# Patient Record
Sex: Male | Born: 1950 | Race: Black or African American | Hispanic: No | Marital: Single | Smoking: Former smoker
Health system: Southern US, Community
[De-identification: ages and names within clinical notes are randomized; demographics above are authoritative.]

## PROBLEM LIST (undated history)

## (undated) DIAGNOSIS — N400 Enlarged prostate without lower urinary tract symptoms: Secondary | ICD-10-CM

## (undated) DIAGNOSIS — I10 Essential (primary) hypertension: Secondary | ICD-10-CM

## (undated) DIAGNOSIS — E785 Hyperlipidemia, unspecified: Secondary | ICD-10-CM

## (undated) HISTORY — DX: Hyperlipidemia, unspecified: E78.5

## (undated) HISTORY — DX: Benign prostatic hyperplasia without lower urinary tract symptoms: N40.0

---

## 2004-09-10 ENCOUNTER — Ambulatory Visit: Payer: Self-pay | Admitting: Internal Medicine

## 2005-06-16 ENCOUNTER — Ambulatory Visit: Payer: Self-pay | Admitting: Internal Medicine

## 2005-08-18 ENCOUNTER — Ambulatory Visit: Payer: Self-pay | Admitting: Specialist

## 2006-03-10 ENCOUNTER — Emergency Department: Payer: Self-pay | Admitting: Emergency Medicine

## 2006-03-14 ENCOUNTER — Ambulatory Visit: Payer: Self-pay | Admitting: General Practice

## 2006-03-14 ENCOUNTER — Other Ambulatory Visit: Payer: Self-pay

## 2006-03-27 ENCOUNTER — Encounter: Payer: Self-pay | Admitting: General Practice

## 2006-04-01 ENCOUNTER — Encounter: Payer: Self-pay | Admitting: General Practice

## 2006-09-19 ENCOUNTER — Ambulatory Visit: Payer: Self-pay | Admitting: Internal Medicine

## 2007-09-24 ENCOUNTER — Ambulatory Visit: Payer: Self-pay | Admitting: Internal Medicine

## 2008-09-23 ENCOUNTER — Ambulatory Visit: Payer: Self-pay | Admitting: Internal Medicine

## 2009-09-24 ENCOUNTER — Other Ambulatory Visit: Payer: Self-pay | Admitting: Internal Medicine

## 2010-09-28 ENCOUNTER — Other Ambulatory Visit: Payer: Self-pay | Admitting: Internal Medicine

## 2011-09-28 ENCOUNTER — Other Ambulatory Visit: Payer: Self-pay | Admitting: Internal Medicine

## 2011-09-28 LAB — HEPATIC FUNCTION PANEL A (ARMC)
Albumin: 3.8 g/dL (ref 3.4–5.0)
Bilirubin, Direct: 0.1 mg/dL (ref 0.00–0.20)
Bilirubin,Total: 0.5 mg/dL (ref 0.2–1.0)
SGPT (ALT): 39 U/L
Total Protein: 7.5 g/dL (ref 6.4–8.2)

## 2011-09-28 LAB — LIPID PANEL
Ldl Cholesterol, Calc: 133 mg/dL — ABNORMAL HIGH (ref 0–100)
Triglycerides: 80 mg/dL (ref 0–200)

## 2012-09-28 ENCOUNTER — Other Ambulatory Visit: Payer: Self-pay | Admitting: Internal Medicine

## 2012-09-28 LAB — COMPREHENSIVE METABOLIC PANEL
Albumin: 3.8 g/dL (ref 3.4–5.0)
Anion Gap: 6 — ABNORMAL LOW (ref 7–16)
BUN: 8 mg/dL (ref 7–18)
Calcium, Total: 8.7 mg/dL (ref 8.5–10.1)
Chloride: 105 mmol/L (ref 98–107)
Co2: 28 mmol/L (ref 21–32)
EGFR (African American): >60
EGFR (Non-African Amer.): >60
Glucose: 98 mg/dL (ref 65–99)
Osmolality: 276 (ref 275–301)
Potassium: 4.4 mmol/L (ref 3.5–5.1)
SGOT(AST): 31 U/L (ref 15–37)
SGPT (ALT): 34 U/L (ref 12–78)
Total Protein: 7.1 g/dL (ref 6.4–8.2)

## 2012-09-28 LAB — LIPID PANEL
HDL Cholesterol: 58 mg/dL (ref 40–60)
Ldl Cholesterol, Calc: 64 mg/dL (ref 0–100)

## 2013-11-13 DIAGNOSIS — N138 Other obstructive and reflux uropathy: Secondary | ICD-10-CM | POA: Insufficient documentation

## 2013-11-13 DIAGNOSIS — R361 Hematospermia: Secondary | ICD-10-CM | POA: Insufficient documentation

## 2013-11-13 DIAGNOSIS — R339 Retention of urine, unspecified: Secondary | ICD-10-CM | POA: Insufficient documentation

## 2013-11-13 DIAGNOSIS — N401 Enlarged prostate with lower urinary tract symptoms: Secondary | ICD-10-CM | POA: Insufficient documentation

## 2013-11-13 DIAGNOSIS — R6882 Decreased libido: Secondary | ICD-10-CM | POA: Insufficient documentation

## 2013-11-13 DIAGNOSIS — N529 Male erectile dysfunction, unspecified: Secondary | ICD-10-CM | POA: Insufficient documentation

## 2014-03-25 DIAGNOSIS — R972 Elevated prostate specific antigen [PSA]: Secondary | ICD-10-CM | POA: Insufficient documentation

## 2014-05-01 DIAGNOSIS — Z79899 Other long term (current) drug therapy: Secondary | ICD-10-CM | POA: Insufficient documentation

## 2014-05-01 DIAGNOSIS — E291 Testicular hypofunction: Secondary | ICD-10-CM | POA: Insufficient documentation

## 2018-07-30 ENCOUNTER — Encounter: Payer: Self-pay | Admitting: Family Medicine

## 2018-08-06 ENCOUNTER — Telehealth: Payer: Self-pay

## 2018-08-06 NOTE — Telephone Encounter (Signed)
Pt left vm to schedule procedure

## 2018-08-07 ENCOUNTER — Other Ambulatory Visit: Payer: Self-pay

## 2018-08-07 DIAGNOSIS — R195 Other fecal abnormalities: Secondary | ICD-10-CM

## 2018-08-07 NOTE — Telephone Encounter (Signed)
Returned patients call.  He has been scheduled for his colonoscopy with Dr. Vicente Males at [facility] on 08/16/18.  Thanks [employer]

## 2018-08-13 ENCOUNTER — Telehealth: Payer: Self-pay | Admitting: Gastroenterology

## 2018-08-13 ENCOUNTER — Encounter: Payer: Self-pay | Admitting: Urology

## 2018-08-13 ENCOUNTER — Other Ambulatory Visit: Payer: Self-pay

## 2018-08-13 ENCOUNTER — Ambulatory Visit (INDEPENDENT_AMBULATORY_CARE_PROVIDER_SITE_OTHER): Payer: Medicare Other | Admitting: Urology

## 2018-08-13 VITALS — BP 171/83 | HR 80 | Ht 72.0 in | Wt 220.6 lb

## 2018-08-13 DIAGNOSIS — N5201 Erectile dysfunction due to arterial insufficiency: Secondary | ICD-10-CM

## 2018-08-13 DIAGNOSIS — R339 Retention of urine, unspecified: Secondary | ICD-10-CM

## 2018-08-13 DIAGNOSIS — R3914 Feeling of incomplete bladder emptying: Secondary | ICD-10-CM

## 2018-08-13 DIAGNOSIS — E291 Testicular hypofunction: Secondary | ICD-10-CM

## 2018-08-13 DIAGNOSIS — N401 Enlarged prostate with lower urinary tract symptoms: Secondary | ICD-10-CM

## 2018-08-13 LAB — URINALYSIS, COMPLETE
Bilirubin, UA: NEGATIVE
Glucose, UA: NEGATIVE
Ketones, UA: NEGATIVE
Leukocytes, UA: NEGATIVE
NITRITE UA: NEGATIVE
Protein, UA: NEGATIVE
Specific Gravity, UA: 1.015 (ref 1.005–1.030)
Urobilinogen, Ur: 0.2 mg/dL (ref 0.2–1.0)
pH, UA: 6.5 (ref 5.0–7.5)

## 2018-08-13 LAB — MICROSCOPIC EXAMINATION
Bacteria, UA: NONE SEEN
EPITHELIAL CELLS (NON RENAL): NONE SEEN /HPF (ref 0–10)
WBC, UA: NONE SEEN /hpf (ref 0–5)

## 2018-08-13 LAB — BLADDER SCAN AMB NON-IMAGING

## 2018-08-13 MED ORDER — PEG 3350-KCL-NA BICARB-NACL 420 G PO SOLR: 4000.0000 mL | Freq: Once | ORAL | 0 refills | Status: AC

## 2018-08-13 MED ORDER — TESTOSTERONE 20.25 MG/ACT (1.62%) TD GEL: TRANSDERMAL | 2 refills | Status: DC

## 2018-08-13 NOTE — Telephone Encounter (Signed)
PLEASE SEND NEW SCRIPT FOR COLONOSCOPY SCHEDULED ON 08-16-2018 WITH DR ANNA. THE SU-PREP WAS OVER $100 DOLLARS WITH INS. PLEASE CALL IN ANOTHER SCRIPT FOR PATIENT

## 2018-08-13 NOTE — Telephone Encounter (Signed)
Patients bowel prep has been changed to [employer].  New rx has been sent to [facility].  Patient has been notified and advised to begin drinking the evening before the procedure at 5pm.  Drink 8 oz every 30 minutes until contents have been completed entirely.  Do not eat or drink 4 hours prior to procedure.  Thanks [employer]

## 2018-08-13 NOTE — Progress Notes (Signed)
08/13/2018 9:17 AM   Jerome Nichols Mar 10, 1951 [id number]  Referring provider: No referring provider defined for this encounter.  Chief Complaint  Patient presents with  . Benign Prostatic Hypertrophy   Urologic history: 1.  Hypogonadism  -On testosterone gel  2.  Erectile dysfunction   -On sildenafil 50 mg  3.  BPH with lower urinary tract symptoms   HPI: 68 year old male presents to establish local urologic care.  He had previously seen Dr. Jacqlyn Larsen at [facility] and was last seen there by 1 of the physician assistants in June 2019.  At that visit he had complained of increased urinary frequency and urgency and was started on tamsulosin however he states he is no longer taking this medication.  He presently has no bothersome lower urinary tract symptoms.  He remains on generic testosterone gel 2 pumps daily.  He has good energy and libido.  He has erectile dysfunction on PDE 5 inhibitor.  He also wanted discuss penile curvature today.  He states he noted leftward curvature of his penis at the base in 2006.  He has no pain with erections.  He estimates the curvature in approximately 30 degrees.  No hourglass deformity.  No history of penile fracture.   PMH: Past Medical History:  Diagnosis Date  . BPH (benign prostatic hyperplasia)   . Hyperlipidemia     Surgical History: History reviewed. No pertinent surgical history.  Home Medications:  Allergies as of 08/13/2018   No Known Allergies     Medication List       Accurate as of August 13, 2018  9:17 AM. Always use your most recent med list.        aspirin EC 81 MG tablet Take by mouth.   simvastatin 20 MG tablet Commonly known as:  ZOCOR Take 20 mg by mouth daily.       Allergies: No Known Allergies  Family History: No family history on file.  Social History:  reports that he quit smoking about 16 years ago. His smoking use included cigarettes. He has never used smokeless tobacco. He reports previous  alcohol use. He reports that he does not use drugs.  ROS: UROLOGY Frequent Urination?: No Hard to postpone urination?: Yes Burning/pain with urination?: No Get up at night to urinate?: Yes Leakage of urine?: Yes Urine stream starts and stops?: No Trouble starting stream?: No Do you have to strain to urinate?: No Blood in urine?: No Urinary tract infection?: No Sexually transmitted disease?: No Injury to kidneys or bladder?: No Painful intercourse?: No Weak stream?: No Erection problems?: Yes Penile pain?: No  Gastrointestinal Nausea?: No Vomiting?: No Indigestion/heartburn?: No Diarrhea?: No Constipation?: No  Constitutional Fever: No Night sweats?: Yes Weight loss?: No Fatigue?: No  Skin Skin rash/lesions?: No Itching?: Yes  Eyes Blurred vision?: No Double vision?: No  Ears/Nose/Throat Sore throat?: No Sinus problems?: No  Hematologic/Lymphatic Swollen glands?: No Easy bruising?: No  Cardiovascular Leg swelling?: No Chest pain?: No  Respiratory Cough?: No Shortness of breath?: No  Endocrine Excessive thirst?: No  Musculoskeletal Back pain?: No Joint pain?: Yes  Neurological Headaches?: No Dizziness?: No  Psychologic Depression?: No Anxiety?: No  Physical Exam: BP (!) 171/83 (BP Location: Left Arm, Patient Position: Sitting, Cuff Size: Large)   Pulse 80   Ht 6' (1.829 m)   Wt 220 lb 9.6 oz (100.1 kg)   BMI 29.92 kg/m   Constitutional:  Alert and oriented, No acute distress. HEENT: [place] AT, moist mucus membranes.  Trachea midline,  no masses. Cardiovascular: No clubbing, cyanosis, or edema. Respiratory: Normal respiratory effort, no increased work of breathing. GI: Abdomen is soft, nontender, nondistended, no abdominal masses GU: No CVA tenderness.  Penis circumcised.  There is a proximal shaft dorsal plaque.  A left corporal plaque is not appreciated.  Testes descended bilateral without masses or tenderness. Lymph: No cervical or  inguinal lymphadenopathy. Skin: No rashes, bruises or suspicious lesions. Neurologic: Grossly intact, no focal deficits, moving all 4 extremities. Psychiatric: Normal mood and affect.   Assessment & Plan:    1. Hypogonadism in male Doing well on topical TRT.  Refill was sent to his pharmacy.  Follow-up 6 months  2. Erectile dysfunction due to arterial insufficiency Continue sildenafil  3. Benign prostatic hyperplasia with incomplete bladder emptying PVR by bladder scan today was 70 mL   Abbie Sons, MD  [facility] [address], [place], [place] [address] [phone]

## 2018-08-14 ENCOUNTER — Telehealth: Payer: Self-pay | Admitting: Gastroenterology

## 2018-08-14 LAB — HEMATOCRIT: HEMATOCRIT: 43.7 % (ref 37.5–51.0)

## 2018-08-14 LAB — TESTOSTERONE: Testosterone: 590 ng/dL (ref 264–916)

## 2018-08-14 NOTE — Telephone Encounter (Signed)
Patient has been advised to drink bowel prep 8 oz every 30 mins starting at 5pm until he has completed the entire container.  Thanks [employer]

## 2018-08-14 NOTE — Telephone Encounter (Signed)
Has colonoscopy scheduled for Thursday Aug 16, 2018, please call.  Has questions about how to take his prep

## 2018-08-16 ENCOUNTER — Encounter
Admission: RE | Disposition: A | Discharge status: Home / Self Care | Payer: Self-pay | Source: Home / Self Care | Attending: Gastroenterology

## 2018-08-16 ENCOUNTER — Encounter: Payer: Self-pay | Admitting: Emergency Medicine

## 2018-08-16 ENCOUNTER — Ambulatory Visit
Admission: RE | Admit: 2018-08-16 | Discharge: 2018-08-16 | Disposition: A | Discharge status: Home / Self Care | Payer: Medicare Other | Attending: Gastroenterology | Admitting: Gastroenterology

## 2018-08-16 ENCOUNTER — Ambulatory Visit: Payer: Medicare Other | Admitting: Certified Registered"

## 2018-08-16 DIAGNOSIS — K921 Melena: Secondary | ICD-10-CM | POA: Diagnosis not present

## 2018-08-16 DIAGNOSIS — K640 First degree hemorrhoids: Secondary | ICD-10-CM | POA: Diagnosis not present

## 2018-08-16 DIAGNOSIS — E785 Hyperlipidemia, unspecified: Secondary | ICD-10-CM | POA: Insufficient documentation

## 2018-08-16 DIAGNOSIS — K635 Polyp of colon: Secondary | ICD-10-CM | POA: Insufficient documentation

## 2018-08-16 DIAGNOSIS — Z87891 Personal history of nicotine dependence: Secondary | ICD-10-CM | POA: Insufficient documentation

## 2018-08-16 DIAGNOSIS — R195 Other fecal abnormalities: Secondary | ICD-10-CM

## 2018-08-16 DIAGNOSIS — Z7982 Long term (current) use of aspirin: Secondary | ICD-10-CM | POA: Insufficient documentation

## 2018-08-16 DIAGNOSIS — D122 Benign neoplasm of ascending colon: Secondary | ICD-10-CM

## 2018-08-16 HISTORY — PX: COLONOSCOPY WITH PROPOFOL: SHX5780

## 2018-08-16 SURGERY — COLONOSCOPY WITH PROPOFOL
Anesthesia: General

## 2018-08-16 MED ORDER — PROPOFOL 10 MG/ML IV BOLUS
INTRAVENOUS | Status: DC | PRN
  Administered 2018-08-16 (×2): 20 mg via INTRAVENOUS
  Administered 2018-08-16: 30 mg via INTRAVENOUS
  Administered 2018-08-16: 60 mg via INTRAVENOUS
  Administered 2018-08-16: 20 mg via INTRAVENOUS

## 2018-08-16 MED ORDER — SODIUM CHLORIDE 0.9 % IV SOLN
INTRAVENOUS | Status: DC
  Administered 2018-08-16: 1000 mL via INTRAVENOUS

## 2018-08-16 MED ORDER — PROPOFOL 500 MG/50ML IV EMUL
INTRAVENOUS | Status: DC | PRN
  Administered 2018-08-16: 100 ug/kg/min via INTRAVENOUS

## 2018-08-16 MED ORDER — PHENYLEPHRINE HCL 10 MG/ML IJ SOLN
INTRAMUSCULAR | Status: DC | PRN
  Administered 2018-08-16 (×2): 150 ug via INTRAVENOUS

## 2018-08-16 MED ORDER — LIDOCAINE HCL (CARDIAC) PF 100 MG/5ML IV SOSY
PREFILLED_SYRINGE | INTRAVENOUS | Status: DC | PRN
  Administered 2018-08-16: 80 mg via INTRATRACHEAL

## 2018-08-16 MED ORDER — PROPOFOL 10 MG/ML IV BOLUS
INTRAVENOUS | Status: AC
  Filled 2018-08-16: qty 40

## 2018-08-16 NOTE — Op Note (Signed)
[facility] Gastroenterology Patient Name: Jerome Nichols Procedure Date: 08/16/2018 10:11 AM MRN: [id number] Account #: [id number] Date of Birth: January 22, 1951 Admit Type: Outpatient Age: 68 Room: [facility] ENDO ROOM 3 Gender: Male Note Status: Finalized Procedure:            Colonoscopy Indications:          Rectal bleeding Providers:            Jonathon Bellows MD, MD Referring MD:         No Local Md, MD (Referring MD) Medicines:            Monitored Anesthesia Care Complications:        No immediate complications. Procedure:            Pre-Anesthesia Assessment:                       - Prior to the procedure, a History and Physical was                        performed, and patient medications, allergies and                        sensitivities were reviewed. The patient's tolerance of                        previous anesthesia was reviewed.                       - The risks and benefits of the procedure and the                        sedation options and risks were discussed with the                        patient. All questions were answered and informed                        consent was obtained.                       - ASA Grade Assessment: II - A patient with mild                        systemic disease.                       After obtaining informed consent, the colonoscope was                        passed under direct vision. Throughout the procedure,                        the patient's blood pressure, pulse, and oxygen                        saturations were monitored continuously. The                        Colonoscope was introduced through the anus and                        advanced  to the the cecum, identified by the                        appendiceal orifice, IC valve and transillumination.                        The colonoscopy was performed with ease. The patient                        tolerated the procedure well. The quality of the bowel             preparation was good. Findings:      The perianal and digital rectal examinations were normal.      Non-bleeding internal hemorrhoids were found during retroflexion. The       hemorrhoids were medium-sized and Grade I (internal hemorrhoids that do       not prolapse).      A 6 mm polyp was found in the ascending colon. The polyp was sessile.       Polypectomy was attempted, initially using a cold snare. Polyp resection       was incomplete with this device. This intervention then required a       different device and polypectomy technique. The polyp was removed with a       cold biopsy forceps. Resection was complete, but the polyp tissue was       only partially retrieved. Impression:           - Non-bleeding internal hemorrhoids.                       - One 6 mm polyp in the ascending colon, removed with a                        cold biopsy forceps. Complete resection. Partial                        retrieval. Recommendation:       - Discharge patient to home (with escort).                       - Resume previous diet.                       - Continue present medications.                       - Await pathology results.                       - Repeat colonoscopy in 5-10 years for surveillance                        based on pathology results. Procedure Code(s):    --- Professional ---                       [phone], Colonoscopy, flexible; with biopsy, single or                        multiple Diagnosis Code(s):    --- Professional ---  D12.2, Benign neoplasm of ascending colon                       K64.0, First degree hemorrhoids                       K62.5, Hemorrhage of anus and rectum CPT copyright 2018 American Medical Association. All rights reserved. The codes documented in this report are preliminary and upon coder review may  be revised to meet current compliance requirements. Jonathon Bellows, MD Jonathon Bellows MD, MD 08/16/2018 10:39:15 AM This report  has been signed electronically. Number of Addenda: 0 Note Initiated On: 08/16/2018 10:11 AM Scope Withdrawal Time: 0 hours 13 minutes 16 seconds  Total Procedure Duration: 0 hours 19 minutes 8 seconds       [facility]

## 2018-08-16 NOTE — Anesthesia Post-op Follow-up Note (Signed)
Anesthesia QCDR form completed.        

## 2018-08-16 NOTE — Anesthesia Preprocedure Evaluation (Signed)
Anesthesia Evaluation  Patient identified by MRN, date of birth, ID band Patient awake    Reviewed: Allergy & Precautions, H&P , NPO status , Patient's Chart, lab work & pertinent test results, reviewed documented beta blocker date and time   Airway Mallampati: II   Neck ROM: full    Dental  (+) Poor Dentition   Pulmonary neg pulmonary ROS, former smoker,    Pulmonary exam normal        Cardiovascular Exercise Tolerance: Good negative cardio ROS Normal cardiovascular exam Rhythm:regular Rate:Normal     Neuro/Psych negative neurological ROS  negative psych ROS   GI/Hepatic negative GI ROS, Neg liver ROS,   Endo/Other  negative endocrine ROS  Renal/GU negative Renal ROS  negative genitourinary   Musculoskeletal   Abdominal   Peds  Hematology negative hematology ROS (+)   Anesthesia Other Findings Past Medical History: No date: BPH (benign prostatic hyperplasia) No date: Hyperlipidemia History reviewed. No pertinent surgical history. BMI    Body Mass Index:  30.68 kg/m     Reproductive/Obstetrics negative OB ROS                             Anesthesia Physical Anesthesia Plan  ASA: II  Anesthesia Plan: General   Post-op Pain Management:    Induction:   PONV Risk Score and Plan:   Airway Management Planned:   Additional Equipment:   Intra-op Plan:   Post-operative Plan:   Informed Consent: I have reviewed the patients History and Physical, chart, labs and discussed the procedure including the risks, benefits and alternatives for the proposed anesthesia with the patient or authorized representative who has indicated his/her understanding and acceptance.     Dental Advisory Given  Plan Discussed with: CRNA  Anesthesia Plan Comments:         Anesthesia Quick Evaluation

## 2018-08-16 NOTE — H&P (Signed)
Jerome Bellows, MD [address] [place], [place], [place], [address] [address], [place], [place], [place], [address] Phone: [phone]  Fax: [phone]  Primary Care Physician:  Tandy Gaw, [place]   Pre-Procedure History & Physical: HPI:  Jerome Nichols is a 68 y.o. male is here for an colonoscopy.   Past Medical History:  Diagnosis Date  . BPH (benign prostatic hyperplasia)   . Hyperlipidemia     History reviewed. No pertinent surgical history.  Prior to Admission medications   Medication Sig Start Date End Date Taking? Authorizing Provider  aspirin EC 81 MG tablet Take by mouth.   Yes [provider]  simvastatin (ZOCOR) 20 MG tablet Take 20 mg by mouth daily.  10/15/13  Yes [provider]  Testosterone 20.25 MG/ACT (1.62%) GEL 1 pump each shoulder daily 08/13/18  Yes Stoioff, Ronda Fairly, MD    Allergies as of 08/07/2018  . (Not on File)    History reviewed. No pertinent family history.  Social History   Socioeconomic History  . Marital status: Single    Spouse name: Not on file  . Number of children: Not on file  . Years of education: Not on file  . Highest education level: Not on file  Occupational History  . Not on file  Social Needs  . Financial resource strain: Not on file  . Food insecurity:    Worry: Not on file    Inability: Not on file  . Transportation needs:    Medical: Not on file    Non-medical: Not on file  Tobacco Use  . Smoking status: Former Smoker    Types: Cigarettes    Last attempt to quit: 08/01/2002    Years since quitting: 16.0  . Smokeless tobacco: Never Used  Substance and Sexual Activity  . Alcohol use: Not Currently  . Drug use: Never  . Sexual activity: Yes  Lifestyle  . Physical activity:    Days per week: Not on file    Minutes per session: Not on file  . Stress: Not on file  Relationships  . Social connections:    Talks on phone: Not on file    Gets together: Not on file    Attends  religious service: Not on file    Active member of club or organization: Not on file    Attends meetings of clubs or organizations: Not on file    Relationship status: Not on file  . Intimate partner violence:    Fear of current or ex partner: Not on file    Emotionally abused: Not on file    Physically abused: Not on file    Forced sexual activity: Not on file  Other Topics Concern  . Not on file  Social History Narrative  . Not on file    Review of Systems: See HPI, otherwise negative ROS  Physical Exam: BP (!) 183/101   Pulse 77   Temp (!) 95.7 F (35.4 C) (Tympanic)   Resp 17   Ht 5\' 11"  (1.803 m)   Wt 99.8 kg   SpO2 100%   BMI 30.68 kg/m  General:   Alert,  pleasant and cooperative in NAD Head:  Normocephalic and atraumatic. Neck:  Supple; no masses or thyromegaly. Lungs:  Clear throughout to auscultation, normal respiratory effort.    Heart:  +S1, +S2, Regular rate and rhythm, No edema. Abdomen:  Soft, nontender and nondistended. Normal bowel sounds, without guarding, and without rebound.  Neurologic:  Alert and  oriented x4;  grossly normal neurologically.  Impression/Plan: [name] is here for an colonoscopy to be performed for blood in stool. Risks, benefits, limitations, and alternatives regarding  colonoscopy have been reviewed with the patient.  Questions have been answered.  All parties agreeable.   Jerome Bellows, MD  08/16/2018, 10:02 AM

## 2018-08-16 NOTE — Transfer of Care (Signed)
Immediate Anesthesia Transfer of Care Note  Patient: Jerome Nichols  Procedure(s) Performed: COLONOSCOPY WITH PROPOFOL (N/A )  Patient Location: Endoscopy Unit  Anesthesia Type:General  Level of Consciousness: awake  Airway & Oxygen Therapy: Patient Spontanous Breathing and Patient connected to nasal cannula oxygen  Post-op Assessment: Report given to RN and Post -op Vital signs reviewed and stable  Post vital signs: stable  Last Vitals:  Vitals Value Taken Time  BP 123/74 08/16/2018 10:47 AM  Temp 36.4 C 08/16/2018 10:45 AM  Pulse 66 08/16/2018 10:49 AM  Resp 12 08/16/2018 10:49 AM  SpO2 98 % 08/16/2018 10:49 AM  Vitals shown include unvalidated device data.  Last Pain:  Vitals:   08/16/18 0837  TempSrc: Tympanic         Complications: No apparent anesthesia complications

## 2018-08-17 LAB — SURGICAL PATHOLOGY

## 2018-08-21 ENCOUNTER — Telehealth: Payer: Self-pay | Admitting: Urology

## 2018-08-21 ENCOUNTER — Encounter: Payer: Self-pay | Admitting: Gastroenterology

## 2018-08-21 MED ORDER — TESTOSTERONE 20.25 MG/ACT (1.62%) TD GEL: TRANSDERMAL | 2 refills | Status: DC

## 2018-08-21 NOTE — Telephone Encounter (Signed)
Pt wants to change drug store to [facility] on [address] Needs rx refill for testosterone gel pump.

## 2018-08-21 NOTE — Telephone Encounter (Signed)
Testosterone rx has been faxed to [facility] per patient request.  Left message informing patient rx has been sent to [facility] per his request and I called [employer] and canceled the rx with them.

## 2018-08-23 NOTE — Anesthesia Postprocedure Evaluation (Signed)
Anesthesia Post Note  Patient: Jerome Nichols  Procedure(s) Performed: COLONOSCOPY WITH PROPOFOL (N/A )  Patient location during evaluation: PACU Anesthesia Type: General Level of consciousness: awake and alert Pain management: pain level controlled Vital Signs Assessment: post-procedure vital signs reviewed and stable Respiratory status: spontaneous breathing, nonlabored ventilation, respiratory function stable and patient connected to nasal cannula oxygen Cardiovascular status: blood pressure returned to baseline and stable Postop Assessment: no apparent nausea or vomiting Anesthetic complications: no     Last Vitals:  Vitals:   08/16/18 1055 08/16/18 1105  BP: (!) 144/90 (!) 149/88  Pulse:    Resp:    Temp:    SpO2:      Last Pain:  Vitals:   08/17/18 0754  TempSrc:   PainSc: 0-No pain                 Molli Barrows

## 2018-08-28 ENCOUNTER — Telehealth: Payer: Self-pay | Admitting: Urology

## 2018-08-28 NOTE — Telephone Encounter (Signed)
Is this patient a candidate for injections? Currently doing a topical gel. Please advise.

## 2018-08-28 NOTE — Telephone Encounter (Signed)
Pt came into office and wants to have his testosterone changed to injections, he states that the pharmacy says it is cheaper. Pharmacy needs to be changed to [facility] on [address] please.

## 2018-08-29 MED ORDER — TESTOSTERONE CYPIONATE 200 MG/ML IM SOLN: 200.0000 mg | INTRAMUSCULAR | 0 refills | Status: DC

## 2018-08-29 NOTE — Telephone Encounter (Signed)
Rx was sent to his pharmacy.  He will need appointments for injection teaching.  Will need to stop the testosterone gel.  He will need a testosterone level 6 weeks after he starts the injections.

## 2018-09-11 ENCOUNTER — Telehealth: Payer: Self-pay

## 2018-09-11 NOTE — Telephone Encounter (Signed)
Called pt no answer, Left detailed message for pt informing him of the information below.

## 2018-09-11 NOTE — Telephone Encounter (Signed)
Pt calls back and states that he has some testosterone gel left and would like to use all of that supply before beginning injections. Advised pt that was fine, asked pt to call the office back when he was ready to set up injection training. Pt gave verbal understanding.

## 2018-10-16 ENCOUNTER — Other Ambulatory Visit (HOSPITAL_COMMUNITY): Payer: Self-pay | Admitting: Physician Assistant

## 2018-10-16 ENCOUNTER — Other Ambulatory Visit: Payer: Self-pay | Admitting: Physician Assistant

## 2018-10-16 DIAGNOSIS — R042 Hemoptysis: Secondary | ICD-10-CM

## 2018-10-29 ENCOUNTER — Ambulatory Visit
Admission: RE | Admit: 2018-10-29 | Discharge: 2018-10-29 | Disposition: A | Discharge status: Home / Self Care | Payer: Medicare Other | Source: Ambulatory Visit | Attending: Physician Assistant | Admitting: Physician Assistant

## 2018-10-29 ENCOUNTER — Ambulatory Visit: Payer: Medicare Other

## 2018-10-29 ENCOUNTER — Other Ambulatory Visit: Payer: Self-pay

## 2018-10-29 DIAGNOSIS — R042 Hemoptysis: Secondary | ICD-10-CM | POA: Diagnosis present

## 2018-10-29 LAB — POCT I-STAT CREATININE: Creatinine, Ser: 0.8 mg/dL (ref 0.61–1.24)

## 2018-10-29 MED ORDER — IOHEXOL 300 MG/ML  SOLN
75.0000 mL | Freq: Once | INTRAMUSCULAR | Status: AC | PRN
  Administered 2018-10-29: 75 mL via INTRAVENOUS

## 2018-10-31 ENCOUNTER — Other Ambulatory Visit: Payer: Self-pay | Admitting: Physician Assistant

## 2018-10-31 DIAGNOSIS — E041 Nontoxic single thyroid nodule: Secondary | ICD-10-CM

## 2018-11-02 ENCOUNTER — Encounter: Payer: Self-pay | Admitting: *Deleted

## 2019-01-14 ENCOUNTER — Ambulatory Visit: Payer: Medicare Other | Admitting: Gastroenterology

## 2019-02-04 ENCOUNTER — Encounter: Payer: Self-pay | Admitting: Internal Medicine

## 2019-02-04 ENCOUNTER — Other Ambulatory Visit: Payer: Self-pay

## 2019-02-04 ENCOUNTER — Ambulatory Visit (INDEPENDENT_AMBULATORY_CARE_PROVIDER_SITE_OTHER): Payer: Medicare Other | Admitting: Internal Medicine

## 2019-02-04 VITALS — BP 160/110 | HR 74 | Temp 97.2°F | Ht 72.0 in | Wt 239.2 lb

## 2019-02-04 DIAGNOSIS — R911 Solitary pulmonary nodule: Secondary | ICD-10-CM | POA: Diagnosis not present

## 2019-02-04 DIAGNOSIS — R918 Other nonspecific abnormal finding of lung field: Secondary | ICD-10-CM

## 2019-02-04 NOTE — Patient Instructions (Signed)
Follow up CT chest in 3 months  Check Quantiferron GOLD test  Check PFT's

## 2019-02-04 NOTE — Progress Notes (Signed)
Name: Jerome Nichols MRN: [id number] DOB: 1950/08/20     CONSULTATION DATE: 02/04/2019  REFERRING MD : Lenard Lance  CHIEF COMPLAINT: abnormal CT chest   HISTORY OF PRESENT ILLNESS: 68 year old pleasant male seen today for abnormal CT chest Patient has had some bleeding in the back of his throat possible submassive mild hemoptysis over the last 1 year Patient has been seen by ENT and was prescribed PPI for reflux Since then has bleeding has stopped the back of his throat  Patient is a former smoker quit 2004 1 pack a day for 30 years  Patient denies any wheezing Does have intermittent cough Patient does have some mild shortness of breath and dyspnea exertion  No history of chest pain or palpitations  In March 2020 patient had a left upper lobe nodule opacification linear scarring noted on a CT scan of his chest  Images and results reviewed with the patient  At this time patient does not have any remote travel history He does not have any night sweats  No signs of weight loss  He has gained 50 pounds over the last 6 months  This time patient does not have any respiratory distress No acute signs or symptoms of infection     PAST MEDICAL HISTORY :   has a past medical history of BPH (benign prostatic hyperplasia) and Hyperlipidemia.  has a past surgical history that includes Colonoscopy with propofol (N/A, 08/16/2018). Prior to Admission medications   Medication Sig Start Date End Date Taking? Authorizing Provider  aspirin EC 81 MG tablet Take by mouth.   Yes [provider]  simvastatin (ZOCOR) 20 MG tablet Take 20 mg by mouth daily.  10/15/13  Yes [provider]  testosterone cypionate (DEPOTESTOSTERONE CYPIONATE) 200 MG/ML injection Inject 1 mL (200 mg total) into the muscle every 14 (fourteen) days. 08/29/18  Yes Stoioff, Ronda Fairly, MD  omeprazole (PRILOSEC) 20 MG capsule TAKE ONE CAPSULE BY MOUTH EVERY MORNING 30 MINUTES BEFORE BREAKFAST 01/07/19    [provider]  sildenafil (VIAGRA) 50 MG tablet TAKE ONE TABLET BY MOUTH ONCE DAILY AS NEEDED FOR ERECTILE DYSFUNCTION 11/21/18   [provider]  simvastatin (ZOCOR) 10 MG tablet TAKE ONE TABLET BY MOUTH EVERY EVENING FOR CHOLESTEROL 01/14/19   [provider]   No Known Allergies  FAMILY HISTORY:  HTN  SOCIAL HISTORY:  reports that he quit smoking about 16 years ago. His smoking use included cigarettes. He has never used smokeless tobacco. He reports previous alcohol use. He reports that he does not use drugs.    Review of Systems:  Gen:  Denies  fever, sweats, chills weigh loss  HEENT: Denies blurred vision, double vision, ear pain, eye pain, hearing loss, nose bleeds, sore throat Cardiac:  No dizziness, chest pain or heaviness, chest tightness,edema, No JVD Resp:   No cough, -sputum production, -shortness of breath,-wheezing, -hemoptysis,  Gi: Denies swallowing difficulty, stomach pain, nausea or vomiting, diarrhea, constipation, bowel incontinence Gu:  Denies bladder incontinence, burning urine Ext:   Denies Joint pain, stiffness or swelling Skin: Denies  skin rash, easy bruising or bleeding or hives Endoc:  Denies polyuria, polydipsia , polyphagia or weight change Psych:   Denies depression, insomnia or hallucinations  Other:  All other systems negative    BP (!) 160/110 (BP Location: Right Arm, Cuff Size: Normal)   Pulse 74   Temp (!) 97.2 F (36.2 C)   Ht 6' (1.829 m)   Wt 239 lb 3.2 oz (108.5  kg)   SpO2 95%   BMI 32.44 kg/m       SpO2: 95 % O2 Device: None (Room air)    Physical Examination:   GENERAL:NAD, no fevers, chills, no weakness no fatigue HEAD: Normocephalic, atraumatic.  EYES: PERLA, EOMI No scleral icterus.  NECK: Supple. No thyromegaly.  No JVD.  PULMONARY: CTA B/L no wheezing, rhonchi, crackles CARDIOVASCULAR: S1 and S2. Regular rate and rhythm. No murmurs GASTROINTESTINAL: Soft, nontender, nondistended. Positive  bowel sounds.  MUSCULOSKELETAL: No swelling, clubbing, or edema.  NEUROLOGIC: No gross focal neurological deficits. 5/5 strength all extremities SKIN: No ulceration, lesions, rashes, or cyanosis.  PSYCHIATRIC: Insight, judgment intact. -depression -anxiety ALL OTHER ROS ARE NEGATIVE   MEDICATIONS: I have reviewed all medications and confirmed regimen as documented       IMAGING      CT chest images reviewed by me today 02/04/2019  Left upper lobe nodular opacification and linear scarring  Patient will need CT chest in 3 months for follow-up  ASSESSMENT AND PLAN SYNOPSIS  68 year old pleasant male seen today for abnormal CT chest in setting of previous history of submassive hemoptysis with a previous history of extensive smoking history CT scan shows left upper lobe nodular linear opacification could be related to previous infection or inflammation.  This time I recommend obtaining chronic current gold testing to assess for TB as well as repeating CT chest in 3 months to assess for interval changes in size  It is a high risk for primary lung cancer Findings and results explained to the patient Patient will also need pulmonary function testing to assess for underlying COPD    COVID-19 EDUCATION: The signs and symptoms of COVID-19 were discussed with the patient and how to seek care for testing.  The importance of social distancing was discussed today. Hand Washing Techniques and avoid touching face was advised.  MEDICATION ADJUSTMENTS/LABS AND TESTS ORDERED: Follow-up CT chest in 3 months Obtain pulmonary function testing Obtain QuantiFERON gold testing  CURRENT MEDICATIONS REVIEWED AT LENGTH WITH PATIENT TODAY   Patient satisfied with Plan of action and management. All questions answered Follow up in 3 months   Eulala Newcombe Patricia Pesa, M.D.  [facility]  Medical Director [place] Director [facility] Cardio-Pulmonary  Department

## 2019-02-05 ENCOUNTER — Other Ambulatory Visit
Admission: RE | Admit: 2019-02-05 | Discharge: 2019-02-05 | Disposition: A | Discharge status: Home / Self Care | Payer: Medicare Other | Source: Ambulatory Visit | Attending: Internal Medicine | Admitting: Internal Medicine

## 2019-02-05 DIAGNOSIS — R918 Other nonspecific abnormal finding of lung field: Secondary | ICD-10-CM | POA: Diagnosis not present

## 2019-02-05 DIAGNOSIS — R911 Solitary pulmonary nodule: Secondary | ICD-10-CM | POA: Insufficient documentation

## 2019-02-07 LAB — QUANTIFERON-TB GOLD PLUS (RQFGPL)
QuantiFERON Mitogen Value: 8.21 IU/mL
QuantiFERON Nil Value: 0.02 IU/mL
QuantiFERON TB1 Ag Value: 0.01 IU/mL
QuantiFERON TB2 Ag Value: 0.01 IU/mL

## 2019-02-07 LAB — QUANTIFERON-TB GOLD PLUS: QuantiFERON-TB Gold Plus: NEGATIVE

## 2019-02-14 ENCOUNTER — Ambulatory Visit: Payer: Medicare Other | Admitting: Urology

## 2019-03-26 ENCOUNTER — Other Ambulatory Visit: Payer: Self-pay

## 2019-03-26 ENCOUNTER — Ambulatory Visit (INDEPENDENT_AMBULATORY_CARE_PROVIDER_SITE_OTHER): Payer: Medicare Other | Admitting: Gastroenterology

## 2019-03-26 VITALS — BP 157/92 | HR 76 | Temp 97.5°F | Ht 72.0 in | Wt 241.8 lb

## 2019-03-26 DIAGNOSIS — R933 Abnormal findings on diagnostic imaging of other parts of digestive tract: Secondary | ICD-10-CM

## 2019-03-26 NOTE — Progress Notes (Signed)
Jonathon Bellows MD, MRCP(U.K) [address]  [place]  [place], [place] [address]  Main: [phone]  Fax: [phone]   Gastroenterology Consultation  Referring Provider:     Bethena Roys* Primary Care Physician:  Ranae Plumber, [place] Primary Gastroenterologist:  Dr. Jonathon Bellows  Reason for Consultation:     Reflux        HPI:   Jerome Nichols is a 68 y.o. y/o male referred for consultation & management  by Dr. Ranae Plumber, [place].     08/16/2018: Colonoscopy : 6 mm polyp excised- benign.   He says he is here to discuss a CT scan in 09/2018 which should an abnormal pancreas, denies any weight loss, not a smoker, no family history of colon cancer, no abdominal pain. CT chest in 09/3028 showed some abnormality of thepancreas but needed further imaging to determine.   I also noted some abnormality on CT neck at the base of the tongue. He doesn't recall that it was evaluated by ENT after.   No issues with reflux/heartburn/chest discomfort.    Past Medical History:  Diagnosis Date  . BPH (benign prostatic hyperplasia)   . Hyperlipidemia     Past Surgical History:  Procedure Laterality Date  . COLONOSCOPY WITH PROPOFOL N/A 08/16/2018   Procedure: COLONOSCOPY WITH PROPOFOL;  Surgeon: Jonathon Bellows, MD;  Location: [facility] ENDOSCOPY;  Service: Gastroenterology;  Laterality: N/A;    Prior to Admission medications   Medication Sig Start Date End Date Taking? Authorizing Provider  aspirin EC 81 MG tablet Take by mouth.    [provider]  omeprazole (PRILOSEC) 20 MG capsule TAKE ONE CAPSULE BY MOUTH EVERY MORNING 30 MINUTES BEFORE BREAKFAST 01/07/19   [provider]  sildenafil (VIAGRA) 50 MG tablet TAKE ONE TABLET BY MOUTH ONCE DAILY AS NEEDED FOR ERECTILE DYSFUNCTION 11/21/18   [provider]  simvastatin (ZOCOR) 10 MG tablet TAKE ONE TABLET BY MOUTH EVERY EVENING FOR CHOLESTEROL 01/14/19   [provider]  simvastatin (ZOCOR) 20 MG tablet  Take 20 mg by mouth daily.  10/15/13   [provider]  testosterone cypionate (DEPOTESTOSTERONE CYPIONATE) 200 MG/ML injection Inject 1 mL (200 mg total) into the muscle every 14 (fourteen) days. 08/29/18   Stoioff, Ronda Fairly, MD    No family history on file.   Social History   Tobacco Use  . Smoking status: Former Smoker    Types: Cigarettes    Quit date: 08/01/2002    Years since quitting: 16.6  . Smokeless tobacco: Never Used  Substance Use Topics  . Alcohol use: Not Currently  . Drug use: Never    Allergies as of 03/26/2019  . (No Known Allergies)    Review of Systems:    All systems reviewed and negative except where noted in HPI.   Physical Exam:  There were no vitals taken for this visit. No LMP for male patient. Psych:  Alert and cooperative. Normal mood and affect. General:   Alert,  Well-developed, well-nourished, pleasant and cooperative in NAD Head:  Normocephalic and atraumatic. Eyes:  Sclera clear, no icterus.   Conjunctiva pink. Ears:  Normal auditory acuity. Nose:  No deformity, discharge, or lesions. Mouth:  No deformity or lesions,oropharynx pink & moist. Neck:  Supple; no masses or thyromegaly. Lungs:  Respirations even and unlabored.  Clear throughout to auscultation.   No wheezes, crackles, or rhonchi. No acute distress. Heart:  Regular rate and rhythm; no murmurs, clicks, rubs, or gallops. Abdomen:  Normal bowel sounds.  No bruits.  Soft, non-tender and non-distended without masses, hepatosplenomegaly or hernias noted.  No guarding or rebound tenderness.    Neurologic:  Alert and oriented x3;  grossly normal neurologically. Skin:  Intact without significant lesions or rashes. No jaundice. Lymph Nodes:  No significant cervical adenopathy. Psych:  Alert and cooperative. Normal mood and affect.  Imaging Studies: No results found.  Assessment and Plan:   Jerome Nichols is a 68 y.o. y/o male has been referred for abnormal CT of the pancreas. I  also note abnormality of the tongue at the base.   Plan  1. CT of the pancreas  2. Get last office note from ENT- if abnormality not evaluated then will need referral     Follow up in 2-3 weeks   Dr Jonathon Bellows MD,MRCP(U.K)

## 2019-03-28 ENCOUNTER — Telehealth: Payer: Self-pay | Admitting: Gastroenterology

## 2019-03-28 ENCOUNTER — Telehealth: Payer: Self-pay | Admitting: Internal Medicine

## 2019-03-28 NOTE — Telephone Encounter (Signed)
Pt left vm he came to see Dr.Anna 2 days ago and was scheduled for a CT scan he is already scheduled for 05/06/19 and would like to consolidate his CT from 04/09/19 to do both together please call pt

## 2019-03-28 NOTE — Telephone Encounter (Signed)
Pt is scheduled for CT chest 05/06/2019, however he is also scheduled for CT abdomen 04/09/2019 as well. He is questioning if he can do both scans on 04/09/2019?  DK please advise. Thanks

## 2019-04-01 NOTE — Telephone Encounter (Signed)
Called and spoke with Central Sched[facility] and they are unable to move CT Chest to 04/09/2019 per pt's request.   LMOVM for patient to return my call. Rhonda J Cobb

## 2019-04-01 NOTE — Telephone Encounter (Signed)
Jerome Nichols can you help with this?

## 2019-04-01 NOTE — Telephone Encounter (Signed)
I think that should be oK

## 2019-04-02 NOTE — Telephone Encounter (Signed)
Called and spoke with patient and advised him that with [facility] would be unable to schedule CT Chest on the same day with the CT Abd/Pelvis. Pt was upset that this could not be arranged.  Advised the patient that with only 7 days left to his CT Abd [facility] scheduled has no other openings on that day.  Pt stated "that he was going to think about it and will call us back if he decides to cancel the CT Chest and take his chances".  Rhonda J Cobb

## 2019-04-02 NOTE — Telephone Encounter (Signed)
Spoke with pt and informed him that Dr. Zoila Shutter office tried contacting him to explain that they were not able to schedule both CT scans on the same day/time. Pt understands.

## 2019-04-09 ENCOUNTER — Other Ambulatory Visit: Payer: Self-pay

## 2019-04-09 ENCOUNTER — Encounter: Payer: Self-pay | Admitting: Gastroenterology

## 2019-04-09 ENCOUNTER — Ambulatory Visit: Payer: Medicare Other | Admitting: Urology

## 2019-04-09 ENCOUNTER — Ambulatory Visit
Admission: RE | Admit: 2019-04-09 | Discharge: 2019-04-09 | Disposition: A | Discharge status: Home / Self Care | Payer: Medicare Other | Source: Ambulatory Visit | Attending: Gastroenterology | Admitting: Gastroenterology

## 2019-04-09 DIAGNOSIS — R933 Abnormal findings on diagnostic imaging of other parts of digestive tract: Secondary | ICD-10-CM

## 2019-04-09 HISTORY — DX: Essential (primary) hypertension: I10

## 2019-04-09 LAB — POCT I-STAT CREATININE: Creatinine, Ser: 0.9 mg/dL (ref 0.61–1.24)

## 2019-04-09 MED ORDER — IOHEXOL 300 MG/ML  SOLN
100.0000 mL | Freq: Once | INTRAMUSCULAR | Status: AC | PRN
  Administered 2019-04-09: 09:00:00 100 mL via INTRAVENOUS

## 2019-04-10 ENCOUNTER — Ambulatory Visit: Payer: Medicare Other | Admitting: Urology

## 2019-04-15 ENCOUNTER — Telehealth: Payer: Self-pay | Admitting: Gastroenterology

## 2019-04-15 NOTE — Telephone Encounter (Signed)
Spoke with pt regarding his concern about CT scan report and clarified results. Pt understands.

## 2019-04-15 NOTE — Telephone Encounter (Signed)
Pt left vm he would Urgently like to speak to Dr. Georgeann Oppenheim nurse regarding a report he received in the mail last week

## 2019-05-03 ENCOUNTER — Other Ambulatory Visit: Payer: Medicare Other

## 2019-05-03 ENCOUNTER — Telehealth: Payer: Self-pay | Admitting: Internal Medicine

## 2019-05-03 NOTE — Telephone Encounter (Signed)
Left message x2 for pt. 

## 2019-05-03 NOTE — Telephone Encounter (Signed)
Left message to really date/time of covid test.  05/06/2019 [facility]

## 2019-05-06 ENCOUNTER — Other Ambulatory Visit: Payer: Self-pay

## 2019-05-06 ENCOUNTER — Ambulatory Visit
Admission: RE | Admit: 2019-05-06 | Discharge: 2019-05-06 | Disposition: A | Discharge status: Home / Self Care | Payer: Medicare Other | Source: Ambulatory Visit | Attending: Internal Medicine | Admitting: Internal Medicine

## 2019-05-06 ENCOUNTER — Other Ambulatory Visit
Admission: RE | Admit: 2019-05-06 | Discharge: 2019-05-06 | Disposition: A | Discharge status: Home / Self Care | Payer: Medicare Other | Source: Ambulatory Visit | Attending: Internal Medicine | Admitting: Internal Medicine

## 2019-05-06 ENCOUNTER — Ambulatory Visit: Payer: Medicare Other | Admitting: Gastroenterology

## 2019-05-06 DIAGNOSIS — R911 Solitary pulmonary nodule: Secondary | ICD-10-CM | POA: Diagnosis not present

## 2019-05-06 DIAGNOSIS — R918 Other nonspecific abnormal finding of lung field: Secondary | ICD-10-CM | POA: Insufficient documentation

## 2019-05-06 DIAGNOSIS — Z20828 Contact with and (suspected) exposure to other viral communicable diseases: Secondary | ICD-10-CM | POA: Diagnosis not present

## 2019-05-06 LAB — SARS CORONAVIRUS 2 (TAT 6-24 HRS): SARS Coronavirus 2: NEGATIVE

## 2019-05-06 NOTE — Telephone Encounter (Signed)
Pt is aware of date/time date.

## 2019-05-07 ENCOUNTER — Ambulatory Visit: Payer: Medicare Other | Attending: Internal Medicine

## 2019-05-07 DIAGNOSIS — R911 Solitary pulmonary nodule: Secondary | ICD-10-CM | POA: Diagnosis not present

## 2019-05-07 MED ORDER — ALBUTEROL SULFATE (2.5 MG/3ML) 0.083% IN NEBU
2.5000 mg | INHALATION_SOLUTION | Freq: Once | RESPIRATORY_TRACT | Status: AC
  Administered 2019-05-07: 2.5 mg via RESPIRATORY_TRACT
  Filled 2019-05-07: qty 3

## 2019-05-15 ENCOUNTER — Telehealth: Payer: Self-pay | Admitting: Internal Medicine

## 2019-05-15 NOTE — Telephone Encounter (Signed)
Pt is requesting CT and PFT results.  DK please advise. Thanks

## 2019-05-15 NOTE — Telephone Encounter (Signed)
Pt is aware that a message has been sent to Dr. Mortimer Fries via advice request.  Advised pt that once results are received, our office will contact him .  Pt voiced his understanding.  Will leave message open to ensure f/u.

## 2019-05-17 NOTE — Telephone Encounter (Signed)
Will route to Dr. Mortimer Fries for results of Chest CT and PFT.   Dr. Mortimer Fries please advise. Thanks.

## 2019-05-20 ENCOUNTER — Ambulatory Visit (INDEPENDENT_AMBULATORY_CARE_PROVIDER_SITE_OTHER): Payer: Medicare Other | Admitting: Urology

## 2019-05-20 ENCOUNTER — Other Ambulatory Visit: Payer: Self-pay

## 2019-05-20 ENCOUNTER — Encounter: Payer: Self-pay | Admitting: Urology

## 2019-05-20 VITALS — BP 173/100 | HR 80 | Ht 72.0 in | Wt 246.8 lb

## 2019-05-20 DIAGNOSIS — G5600 Carpal tunnel syndrome, unspecified upper limb: Secondary | ICD-10-CM | POA: Insufficient documentation

## 2019-05-20 DIAGNOSIS — E785 Hyperlipidemia, unspecified: Secondary | ICD-10-CM | POA: Insufficient documentation

## 2019-05-20 DIAGNOSIS — E291 Testicular hypofunction: Secondary | ICD-10-CM | POA: Diagnosis not present

## 2019-05-20 DIAGNOSIS — N5201 Erectile dysfunction due to arterial insufficiency: Secondary | ICD-10-CM

## 2019-05-20 DIAGNOSIS — N401 Enlarged prostate with lower urinary tract symptoms: Secondary | ICD-10-CM | POA: Diagnosis not present

## 2019-05-20 MED ORDER — XYOSTED 75 MG/0.5ML ~~LOC~~ SOAJ: 75.0000 mg | SUBCUTANEOUS | 3 refills | Status: DC

## 2019-05-20 MED ORDER — SILDENAFIL CITRATE 20 MG PO TABS: ORAL_TABLET | ORAL | 0 refills | Status: DC

## 2019-05-20 MED ORDER — ALFUZOSIN HCL ER 10 MG PO TB24: 10.0000 mg | ORAL_TABLET | Freq: Every day | ORAL | 2 refills | Status: DC

## 2019-05-20 NOTE — Progress Notes (Signed)
05/20/2019 1:40 PM   Jerome Nichols 1951/03/09 [id number]  Referring provider: Ranae Plumber, [place],  [place] [address]  Chief Complaint  Patient presents with  . Erectile Dysfunction    Urologic history: 1.  Hypogonadism             -On testosterone gel  2.  Erectile dysfunction                        -On sildenafil 50 mg  3.  BPH with lower urinary tract symptoms   HPI: 68 y.o. male presents for follow-up of the above problem list.  He called back several weeks after he was seen January 2020 stating he wanted to switch from gels to testosterone injections due to his insurance coverage.  He was informed he would need an appointment to learn injections and an Rx was sent to his pharmacy however he never picked this medication up and has been taking gel intermittently.  He is also noted worsening lower urinary tract symptoms with increased urinary frequency and urgency.  He also has decreased force and caliber of his urinary stream.  He gets up twice per night to void.  IPSS completed today was 18/35.  Denies dysuria or gross hematuria.   PMH: Past Medical History:  Diagnosis Date  . BPH (benign prostatic hyperplasia)   . Hyperlipidemia   . Hypertension     Surgical History: Past Surgical History:  Procedure Laterality Date  . COLONOSCOPY WITH PROPOFOL N/A 08/16/2018   Procedure: COLONOSCOPY WITH PROPOFOL;  Surgeon: Jonathon Bellows, MD;  Location: [facility] ENDOSCOPY;  Service: Gastroenterology;  Laterality: N/A;    Home Medications:  Allergies as of 05/20/2019   No Known Allergies     Medication List       Accurate as of May 20, 2019  1:40 PM. If you have any questions, ask your nurse or doctor.        STOP taking these medications   omeprazole 20 MG capsule Commonly known as: PRILOSEC Stopped by: Abbie Sons, MD     TAKE these medications   aspirin EC 81 MG tablet Take by mouth.   meloxicam 7.5 MG tablet Commonly known  as: MOBIC Take 7.5 mg by mouth daily.   sildenafil 50 MG tablet Commonly known as: VIAGRA TAKE ONE TABLET BY MOUTH ONCE DAILY AS NEEDED FOR ERECTILE DYSFUNCTION   simvastatin 20 MG tablet Commonly known as: ZOCOR Take 20 mg by mouth daily. What changed: Another medication with the same name was removed. Continue taking this medication, and follow the directions you see here. Changed by: Abbie Sons, MD   tamsulosin 0.4 MG Caps capsule Commonly known as: FLOMAX Take by mouth.   testosterone cypionate 200 MG/ML injection Commonly known as: DEPOTESTOSTERONE CYPIONATE Inject 1 mL (200 mg total) into the muscle every 14 (fourteen) days.       Allergies: No Known Allergies  Family History: No family history on file.  Social History:  reports that he quit smoking about 16 years ago. His smoking use included cigarettes. He has never used smokeless tobacco. He reports previous alcohol use. He reports that he does not use drugs.  ROS: UROLOGY Frequent Urination?: Yes Hard to postpone urination?: Yes Burning/pain with urination?: No Get up at night to urinate?: Yes Leakage of urine?: No Urine stream starts and stops?: No Trouble starting stream?: No Do you have to strain to urinate?: No Blood in urine?:  No Urinary tract infection?: No Sexually transmitted disease?: No Injury to kidneys or bladder?: No Painful intercourse?: No Weak stream?: No Erection problems?: Yes Penile pain?: No  Gastrointestinal Nausea?: No Vomiting?: No Indigestion/heartburn?: No Diarrhea?: No Constipation?: No  Constitutional Fever: No Night sweats?: No Weight loss?: No Fatigue?: No  Skin Skin rash/lesions?: No Itching?: No  Eyes Blurred vision?: No Double vision?: No  Ears/Nose/Throat Sore throat?: No Sinus problems?: No  Hematologic/Lymphatic Swollen glands?: No Easy bruising?: No  Cardiovascular Leg swelling?: No Chest pain?: No  Respiratory Cough?: No Shortness  of breath?: No  Endocrine Excessive thirst?: No  Musculoskeletal Back pain?: No Joint pain?: Yes  Neurological Headaches?: No Dizziness?: No  Psychologic Depression?: No Anxiety?: No  Physical Exam: BP (!) 173/100 (BP Location: Left Arm, Patient Position: Sitting, Cuff Size: Large)   Pulse 80   Ht 6' (1.829 m)   Wt 246 lb 12.8 oz (111.9 kg)   BMI 33.47 kg/m   Constitutional:  Alert and oriented, No acute distress. HEENT: [place] AT, moist mucus membranes.  Trachea midline, no masses. Cardiovascular: No clubbing, cyanosis, or edema. Respiratory: Normal respiratory effort, no increased work of breathing. GI: Abdomen is soft, nontender, nondistended, no abdominal masses GU: Prostate 45 g, smooth without nodules Skin: No rashes, bruises or suspicious lesions. Neurologic: Grossly intact, no focal deficits, moving all 4 extremities. Psychiatric: Normal mood and affect.   Assessment & Plan:    - Hypogonadism in male He has been taking his gels intermittently.  PSA, testosterone and hematocrit were drawn.  He desires to switch to injections and specifically inquired about availability of a subcutaneous autoinjector.  He was informed what is available however may not be covered by his insurance.  We will send in Rx Xyosted to see if it will get approved.  - Benign prostatic hyperplasia with lower urinary tract symptoms, symptom details unspecified He notes worsening lower urinary tract symptoms and was interested in a trial of medical management. Tamsulosin is not covered by his insurance however alfuzosin is the covered alpha-blocker.  Rx was sent.  - Erectile dysfunction He has been on sildenafil 50 mg and for cost has requested the 20 mg dose.  Rx was sent.   Abbie Sons, [place] [address] [place], [place] [address] [phone]

## 2019-05-21 ENCOUNTER — Encounter: Payer: Self-pay | Admitting: Urology

## 2019-05-21 LAB — TESTOSTERONE: Testosterone: 434 ng/dL (ref 264–916)

## 2019-05-21 LAB — HEMATOCRIT: Hematocrit: 45.1 % (ref 37.5–51.0)

## 2019-05-21 LAB — PSA: Prostate Specific Ag, Serum: 0.8 ng/mL (ref 0.0–4.0)

## 2019-05-22 ENCOUNTER — Telehealth: Payer: Self-pay | Admitting: Urology

## 2019-05-22 ENCOUNTER — Telehealth: Payer: Self-pay

## 2019-05-22 NOTE — Telephone Encounter (Signed)
Called pt informed him that alfuzosin was sent in to substitute Tamsolusin for cost purposes only. Advised pt that Rx for Berdine Addison was sent to [facility] as they are able to complete PA for medication and is more cost effective. Pt gave verbal understanding.

## 2019-05-22 NOTE — Telephone Encounter (Signed)
Pt informed via mychart

## 2019-05-22 NOTE — Telephone Encounter (Signed)
Pt would like a call back to discuss medications that were prescribed on 05/20/2019.

## 2019-05-22 NOTE — Telephone Encounter (Signed)
-----   Message from Abbie Sons, MD sent at 05/22/2019  7:21 AM EDT ----- PSA stable at 0.8.  Testosterone level was normal at 434.  Hematocrit was normal.

## 2019-05-24 ENCOUNTER — Telehealth: Payer: Self-pay

## 2019-05-24 NOTE — Telephone Encounter (Signed)
Spoke with patient and advised him that sildenafil was denied by insurance.  Other options explained to patient.

## 2019-05-27 NOTE — Progress Notes (Signed)
Jerome Nichols was approved by [employer] [facility] until 07/31/2020.

## 2019-06-03 NOTE — Telephone Encounter (Signed)
Pt stated that he still hasn't received his results form his CT Scan and PFT. If someone could please give him a call back

## 2019-06-03 NOTE — Telephone Encounter (Signed)
Dr. Mortimer Fries please advise on CT and PFT results.

## 2019-06-03 NOTE — Telephone Encounter (Signed)
Please see 06/01/2019 mychart message. Pt needs appt to discuss results per DK. Pt has been scheduled for phone visit tomorrow at 9:30 with DK. Nothing further is needed.

## 2019-06-04 ENCOUNTER — Ambulatory Visit (INDEPENDENT_AMBULATORY_CARE_PROVIDER_SITE_OTHER): Payer: Medicare Other | Admitting: Internal Medicine

## 2019-06-04 ENCOUNTER — Other Ambulatory Visit: Payer: Self-pay

## 2019-06-04 ENCOUNTER — Encounter: Payer: Self-pay | Admitting: Internal Medicine

## 2019-06-04 ENCOUNTER — Encounter: Payer: Self-pay | Admitting: Urology

## 2019-06-04 DIAGNOSIS — R918 Other nonspecific abnormal finding of lung field: Secondary | ICD-10-CM

## 2019-06-04 NOTE — Progress Notes (Signed)
Name: Jerome Nichols MRN: [id number] DOB: [birth year]     I connected with the patient by telephone enabled telemedicine visit and verified that I am speaking with the correct person using two identifiers.    I discussed the limitations, risks, security and privacy concerns of performing an evaluation and management service by telemedicine and the availability of in-person appointments. I also discussed with the patient that there may be a patient responsible charge related to this service. The patient expressed understanding and agreed to proceed.  PATIENT AGREES AND CONFIRMS -YES   Other persons participating in the visit and their role in the encounter: Patient, nursing  This visit type was conducted due to national recommendations for restrictions regarding the COVID-19 Pandemic (e.g. social distancing).  This format is felt to be most appropriate for this patient at this time.  All issues noted in this document were discussed and addressed.       CHIEF COMPLAINT:  Follow up Abnormal CT chest    HISTORY OF PRESENT ILLNESS:  PFT reviewed with patient Spirometry WNL No obvious obstructive or restrictive pattern  CT chest reviewed increased in nodular opacities over last 8 months however these have been present since 2012   No evidence of heart failure at this time No evidence or signs of infection at this time No respiratory distress No fevers, chills, nausea, vomiting, diarrhea No evidence of lower extremity edema No evidence hemoptysis    PAST MEDICAL HISTORY :   has a past medical history of BPH (benign prostatic hyperplasia), Hyperlipidemia, and Hypertension.  has a past surgical history that includes Colonoscopy with propofol (N/A, 08/16/2018). Prior to Admission medications   Medication Sig Start Date End Date Taking? Authorizing Provider  aspirin EC 81 MG tablet Take by mouth.   Yes [provider]  simvastatin (ZOCOR) 20 MG tablet Take 20 mg by mouth  daily.  10/15/13  Yes [provider]  testosterone cypionate (DEPOTESTOSTERONE CYPIONATE) 200 MG/ML injection Inject 1 mL (200 mg total) into the muscle every 14 (fourteen) days. 08/29/18  Yes Stoioff, Ronda Fairly, MD  omeprazole (PRILOSEC) 20 MG capsule TAKE ONE CAPSULE BY MOUTH EVERY MORNING 30 MINUTES BEFORE BREAKFAST 01/07/19   [provider]  sildenafil (VIAGRA) 50 MG tablet TAKE ONE TABLET BY MOUTH ONCE DAILY AS NEEDED FOR ERECTILE DYSFUNCTION 11/21/18   [provider]  simvastatin (ZOCOR) 10 MG tablet TAKE ONE TABLET BY MOUTH EVERY EVENING FOR CHOLESTEROL 01/14/19   [provider]   No Known Allergies  FAMILY HISTORY:  HTN  SOCIAL HISTORY:  reports that he quit smoking about 16 years ago. His smoking use included cigarettes. He has never used smokeless tobacco. He reports previous alcohol use. He reports that he does not use drugs.    Review of Systems:  Gen:  Denies  fever, sweats, chills weight loss  HEENT: Denies blurred vision, double vision, ear pain, eye pain, hearing loss, nose bleeds, sore throat Cardiac:  No dizziness, chest pain or heaviness, chest tightness,edema, No JVD Resp:   No cough, -sputum production, -shortness of breath,-wheezing, -hemoptysis,  Gi: Denies swallowing difficulty, stomach pain, nausea or vomiting, diarrhea, constipation, bowel incontinence Gu:  Denies bladder incontinence, burning urine Ext:   Denies Joint pain, stiffness or swelling Skin: Denies  skin rash, easy bruising or bleeding or hives Endoc:  Denies polyuria, polydipsia , polyphagia or weight change Psych:   Denies depression, insomnia or hallucinations  Other:  All other systems negative  IMAGING      ASSESSMENT AND PLAN SYNOPSIS Abnormal CT chest-nodular opacities since 2012 but have have enlarged Needs repeat CT chest in 6 months     COVID-19 EDUCATION: The signs and symptoms of COVID-19 were discussed with the  patient and how to seek care for testing.  The importance of social distancing was discussed today. Hand Washing Techniques and avoid touching face was advised.  MEDICATION ADJUSTMENTS/LABS AND TESTS ORDERED: Follow up CT chest in 6 months  CURRENT MEDICATIONS REVIEWED AT LENGTH WITH PATIENT TODAY   Patient satisfied with Plan of action and management. All questions answered Follow up in 6 months   Skyler Carel Patricia Pesa, M.D.  [facility]  Medical Director [place] Director [facility] Cardio-Pulmonary Department

## 2019-06-04 NOTE — Patient Instructions (Signed)
Ct chest in 6 months

## 2019-06-06 ENCOUNTER — Telehealth: Payer: Self-pay | Admitting: Urology

## 2019-06-06 NOTE — Telephone Encounter (Signed)
Pt would like a call back to discuss medication options, he can't afford Xyosted.

## 2019-06-06 NOTE — Telephone Encounter (Signed)
Called pt he states that after pricing testosterone gels and xyosted he would like to move forward with traditional IM testosterone injections. He would like this sent to [facility]. Pharmacy updated.

## 2019-06-07 MED ORDER — TESTOSTERONE CYPIONATE 200 MG/ML IM SOLN: 200.0000 mg | INTRAMUSCULAR | 0 refills | Status: DC

## 2019-06-07 NOTE — Telephone Encounter (Signed)
Rx sent.  Will need an appointment for injection training or injections if he is going to receive an office.

## 2019-06-10 NOTE — Telephone Encounter (Signed)
Spoke with patient and advised him rx was sent.  He will call back and schedule teaching.

## 2019-06-11 ENCOUNTER — Encounter: Payer: Self-pay | Admitting: Urology

## 2019-06-11 ENCOUNTER — Ambulatory Visit (INDEPENDENT_AMBULATORY_CARE_PROVIDER_SITE_OTHER): Payer: Medicare Other | Admitting: Urology

## 2019-06-11 ENCOUNTER — Other Ambulatory Visit: Payer: Self-pay

## 2019-06-11 VITALS — BP 165/101 | HR 82 | Ht 72.0 in | Wt 247.5 lb

## 2019-06-11 DIAGNOSIS — E291 Testicular hypofunction: Secondary | ICD-10-CM | POA: Diagnosis not present

## 2019-06-11 MED ORDER — TESTOSTERONE CYPIONATE 200 MG/ML IM SOLN
200.0000 mg | Freq: Once | INTRAMUSCULAR | Status: AC
  Administered 2019-06-11: 200 mg via INTRAMUSCULAR

## 2019-06-11 NOTE — Progress Notes (Signed)
I have spoken with [name] at [facility] and Mr. Jerome Nichols has made an appointment to discuss his high blood pressures.

## 2019-06-11 NOTE — Progress Notes (Signed)
Testosterone IM Injection  Due to Hypogonadism patient is present today for a Testosterone Injection.  Medication: Testosterone Cypionate Dose: 73ml Location: right upper outer buttocks Lot: [id number] Exp:09/2021  Patient tolerated well, no complications were noted  Preformed by: Elberta Leatherwood, CMA  Follow up: 2 weeks

## 2019-06-24 ENCOUNTER — Other Ambulatory Visit: Payer: Self-pay

## 2019-06-24 ENCOUNTER — Ambulatory Visit (INDEPENDENT_AMBULATORY_CARE_PROVIDER_SITE_OTHER): Payer: Medicare Other

## 2019-06-24 DIAGNOSIS — E291 Testicular hypofunction: Secondary | ICD-10-CM | POA: Diagnosis not present

## 2019-06-24 MED ORDER — TESTOSTERONE CYPIONATE 200 MG/ML IM SOLN
200.0000 mg | Freq: Once | INTRAMUSCULAR | Status: AC
  Administered 2019-06-24: 200 mg via INTRAMUSCULAR

## 2019-06-24 NOTE — Progress Notes (Signed)
Testosterone IM Injection  Due to Hypogonadism patient is present today for a Testosterone Injection.  Medication: Testosterone Cypionate Dose: 53mL Location: left upper outer buttocks Lot: [id number] Exp:09/2021  Patient tolerated well, no complications were noted  Preformed by: Gordy Clement, CMA (AAMA)  Follow up: As Scheduled for net injection

## 2019-06-25 ENCOUNTER — Ambulatory Visit: Payer: Medicare Other

## 2019-07-02 ENCOUNTER — Encounter: Payer: Self-pay | Admitting: Urology

## 2019-07-04 ENCOUNTER — Ambulatory Visit: Payer: Medicare Other

## 2019-07-04 ENCOUNTER — Ambulatory Visit: Payer: Medicare Other | Admitting: Urology

## 2019-07-09 ENCOUNTER — Other Ambulatory Visit: Payer: Self-pay

## 2019-07-09 ENCOUNTER — Ambulatory Visit (INDEPENDENT_AMBULATORY_CARE_PROVIDER_SITE_OTHER): Payer: Medicare Other | Admitting: *Deleted

## 2019-07-09 DIAGNOSIS — E291 Testicular hypofunction: Secondary | ICD-10-CM | POA: Diagnosis not present

## 2019-07-09 MED ORDER — TESTOSTERONE CYPIONATE 200 MG/ML IM SOLN
200.0000 mg | Freq: Once | INTRAMUSCULAR | Status: AC
  Administered 2019-07-09: 200 mg via INTRAMUSCULAR

## 2019-07-09 NOTE — Progress Notes (Signed)
Testosterone IM Injection  Due to Hypogonadism patient is present today for a Testosterone Injection.  Medication: Testosterone Cypionate Dose: 41ml Location: left upper outer buttocks Lot: [id number] Exp:01/2022  Patient tolerated well, no complications were noted  Performed By: Verlene Mayer, CMA (AAMA)  Follow up: Two weeks

## 2019-07-23 ENCOUNTER — Other Ambulatory Visit: Payer: Self-pay

## 2019-07-23 ENCOUNTER — Ambulatory Visit (INDEPENDENT_AMBULATORY_CARE_PROVIDER_SITE_OTHER): Payer: Medicare Other | Admitting: *Deleted

## 2019-07-23 DIAGNOSIS — E291 Testicular hypofunction: Secondary | ICD-10-CM | POA: Diagnosis not present

## 2019-07-23 MED ORDER — TESTOSTERONE CYPIONATE 200 MG/ML IM SOLN
200.0000 mg | Freq: Once | INTRAMUSCULAR | Status: AC
  Administered 2019-07-23: 200 mg via INTRAMUSCULAR

## 2019-07-23 NOTE — Progress Notes (Signed)
Testosterone IM Injection  Due to Hypogonadism patient is present today for a Testosterone Injection.  Medication: Testosterone Cypionate Dose: 56ml Location: right upper outer buttocks Lot: [id number] Exp:07/23  Patient tolerated well, no complications were noted  Performed by: Verlene Mayer, CMA  Follow up: Two weeks   Patient had questions regarding self-administering his testosterone injections. Explained the process and would need a nurse visit after provider approval to teach. Patient stated he would think about it and possibly schedule at next visit.

## 2019-08-06 ENCOUNTER — Other Ambulatory Visit: Payer: Self-pay

## 2019-08-06 ENCOUNTER — Ambulatory Visit: Payer: Medicare Other

## 2019-08-06 ENCOUNTER — Ambulatory Visit (INDEPENDENT_AMBULATORY_CARE_PROVIDER_SITE_OTHER): Payer: Medicare Other

## 2019-08-06 DIAGNOSIS — E291 Testicular hypofunction: Secondary | ICD-10-CM | POA: Diagnosis not present

## 2019-08-06 MED ORDER — TESTOSTERONE CYPIONATE 200 MG/ML IM SOLN
200.0000 mg | Freq: Once | INTRAMUSCULAR | Status: AC
  Administered 2019-08-06: 200 mg via INTRAMUSCULAR

## 2019-08-06 NOTE — Progress Notes (Signed)
Testosterone IM Injection  Due to Hypogonadism patient is present today for a Testosterone Injection.  Medication: Testosterone Cypionate Dose: 1 mL Location: left upper outer buttocks Lot: [id number] Exp:02/27/2022  Patient tolerated well, complications were noted as: some bleeding noted, applied pressure. Bleeding stopped, bandaid applied.  Preformed by: Bradly Bienenstock, CMA  Follow up: 2 weeks

## 2019-08-20 ENCOUNTER — Other Ambulatory Visit: Payer: Self-pay

## 2019-08-20 ENCOUNTER — Ambulatory Visit (INDEPENDENT_AMBULATORY_CARE_PROVIDER_SITE_OTHER): Payer: Medicare Other | Admitting: *Deleted

## 2019-08-20 DIAGNOSIS — E291 Testicular hypofunction: Secondary | ICD-10-CM | POA: Diagnosis not present

## 2019-08-20 MED ORDER — TESTOSTERONE CYPIONATE 200 MG/ML IM SOLN
200.0000 mg | Freq: Once | INTRAMUSCULAR | Status: AC
  Administered 2019-08-20: 200 mg via INTRAMUSCULAR

## 2019-08-20 NOTE — Progress Notes (Signed)
Testosterone IM Injection  Due to Hypogonadism patient is present today for a Testosterone Injection.  Medication: Testosterone Cypionate Dose: 47ml Location: left upper outer buttocks Lot: [id number]  Patient tolerated well, no complications were noted  Preformed by: Gaspar Cola cma Follow up: 2 weeks

## 2019-08-24 ENCOUNTER — Encounter: Payer: Self-pay | Admitting: Urology

## 2019-09-03 ENCOUNTER — Other Ambulatory Visit: Payer: Self-pay | Admitting: *Deleted

## 2019-09-03 ENCOUNTER — Ambulatory Visit (INDEPENDENT_AMBULATORY_CARE_PROVIDER_SITE_OTHER): Payer: Medicare Other | Admitting: *Deleted

## 2019-09-03 ENCOUNTER — Other Ambulatory Visit: Payer: Self-pay

## 2019-09-03 DIAGNOSIS — E291 Testicular hypofunction: Secondary | ICD-10-CM

## 2019-09-03 MED ORDER — "BD SAFETYGLIDE NEEDLE 18G X 1-1/2"" MISC": 0 refills | Status: DC

## 2019-09-03 MED ORDER — "BD LUER-LOK SYRINGE 21G X 1-1/2"" 3 ML MISC": 0 refills | Status: DC

## 2019-09-03 MED ORDER — TESTOSTERONE CYPIONATE 200 MG/ML IM SOLN
200.0000 mg | Freq: Once | INTRAMUSCULAR | Status: AC
  Administered 2019-09-03: 200 mg via INTRAMUSCULAR

## 2019-09-03 NOTE — Progress Notes (Signed)
Testosterone IM Injection  Due to Hypogonadism patient is present today for a Testosterone Injection.  Medication: Testosterone Cypionate Dose: 70ml Location: left upper outer buttocks Lot: [id number]  Patient tolerated well, no complications were noted  Preformed by: Gaspar Cola cma Follow up: 2 weeks

## 2019-09-17 ENCOUNTER — Other Ambulatory Visit: Payer: Self-pay

## 2019-09-17 ENCOUNTER — Ambulatory Visit: Payer: Self-pay

## 2019-09-17 ENCOUNTER — Ambulatory Visit (INDEPENDENT_AMBULATORY_CARE_PROVIDER_SITE_OTHER): Payer: Medicare Other | Admitting: Urology

## 2019-09-17 DIAGNOSIS — E291 Testicular hypofunction: Secondary | ICD-10-CM

## 2019-09-17 MED ORDER — TESTOSTERONE CYPIONATE 200 MG/ML IM SOLN
200.0000 mg | Freq: Once | INTRAMUSCULAR | Status: AC
  Administered 2019-09-17: 200 mg via INTRAMUSCULAR

## 2019-09-17 NOTE — Progress Notes (Signed)
Testosterone IM Injection  Due to Hypogonadism patient is present today for a Testosterone Injection.  Medication: Testosterone Cypionate Dose: 104ml Location: right upper outer buttocks Lot: [id number] Exp:01/2022  Patient tolerated well, no complications were noted  Preformed by: Elberta Leatherwood, CMA  Follow up: 2 weeks

## 2019-10-01 ENCOUNTER — Other Ambulatory Visit: Payer: Self-pay

## 2019-10-01 ENCOUNTER — Ambulatory Visit (INDEPENDENT_AMBULATORY_CARE_PROVIDER_SITE_OTHER): Payer: Medicare Other | Admitting: *Deleted

## 2019-10-01 DIAGNOSIS — E291 Testicular hypofunction: Secondary | ICD-10-CM

## 2019-10-01 MED ORDER — TESTOSTERONE CYPIONATE 200 MG/ML IM SOLN
200.0000 mg | Freq: Once | INTRAMUSCULAR | Status: AC
  Administered 2019-10-01: 200 mg via INTRAMUSCULAR

## 2019-10-01 NOTE — Progress Notes (Signed)
Testosterone IM Injection  Due to Hypogonadism patient is present today for a Testosterone Injection.  Medication: Testosterone Cypionate Dose: 75ml Location: right upper outer buttocks Lot: [id number] Exp:04/2022  Patient tolerated well, no complications were noted  Preformed by: Gaspar Cola, CMA  Follow up: 2 weeks

## 2019-10-15 ENCOUNTER — Other Ambulatory Visit: Payer: Self-pay

## 2019-10-15 ENCOUNTER — Ambulatory Visit (INDEPENDENT_AMBULATORY_CARE_PROVIDER_SITE_OTHER): Payer: Medicare Other

## 2019-10-15 DIAGNOSIS — E291 Testicular hypofunction: Secondary | ICD-10-CM

## 2019-10-15 MED ORDER — TESTOSTERONE CYPIONATE 200 MG/ML IM SOLN
200.0000 mg | Freq: Once | INTRAMUSCULAR | Status: AC
  Administered 2019-10-15: 200 mg via INTRAMUSCULAR

## 2019-10-15 NOTE — Progress Notes (Signed)
Testosterone IM Injection  Due to Hypogonadism patient is present today for a Testosterone Injection.  Medication: Testosterone Cypionate Dose: 47ml Location: left upper outer buttocks Lot: [id number]  Patient tolerated well, no complications were noted  Preformed by: Fonnie Jarvis, CMA  Follow up: 2wk injection, patient has no refills next appointment is in one month temporary refill request was sent to [facility]

## 2019-10-16 ENCOUNTER — Other Ambulatory Visit: Payer: Self-pay

## 2019-10-16 MED ORDER — TESTOSTERONE CYPIONATE 200 MG/ML IM SOLN: 200.0000 mg | INTRAMUSCULAR | 1 refills | Status: DC

## 2019-10-29 ENCOUNTER — Ambulatory Visit: Payer: Self-pay

## 2019-10-31 ENCOUNTER — Ambulatory Visit: Payer: Medicare Other

## 2019-11-05 ENCOUNTER — Other Ambulatory Visit: Payer: Self-pay

## 2019-11-05 ENCOUNTER — Ambulatory Visit (INDEPENDENT_AMBULATORY_CARE_PROVIDER_SITE_OTHER): Payer: Medicare Other

## 2019-11-05 DIAGNOSIS — E291 Testicular hypofunction: Secondary | ICD-10-CM | POA: Diagnosis not present

## 2019-11-05 MED ORDER — TESTOSTERONE CYPIONATE 200 MG/ML IM SOLN
200.0000 mg | Freq: Once | INTRAMUSCULAR | Status: AC
  Administered 2019-11-05: 200 mg via INTRAMUSCULAR

## 2019-11-05 NOTE — Progress Notes (Signed)
Testosterone IM Injection  Due to Hypogonadism patient is present today for a Testosterone Injection.  Medication: Testosterone Cypionate Dose: 51mL Location: right upper outer buttocks Lot: [id number] Exp:04/2022  Patient tolerated well, no complications were noted  Preformed by: Bradly Bienenstock, CMA  Follow up: 2 wks (appt to see Dr.Stoioff on Mon Apr 19th, will get test inj then)

## 2019-11-16 NOTE — Progress Notes (Signed)
11/18/19 1:31 PM   Jerome Nichols 1951-05-20 [id number]  Referring provider: Ranae Plumber, [address] [place],  [place] [address]  Chief Complaint  Patient presents with  . Hypogonadism    Urologic history: 1.Hypogonadism -On testosterone gel  2.Erectile dysfunction -On sildenafil 50 mg  3.BPH with lower urinary tract symptoms  HPI: Jerome Nichols is a 69 y.o. M who returns today for 6 month f/u for above problem list.   -last testosterone injection on 11/05/19  -No complaints today, good energy  -Been on sildenafil 20 mg for cost -no change in urinary symptoms  -effectively managed w/ Alfuzosin -reports of breast enlargement  -FHx of breast cancer (mother)  -Blood work 05/2019, PSA stable at 0.8, testosterone 434, hematocrit 45.1  PMH: Past Medical History:  Diagnosis Date  . BPH (benign prostatic hyperplasia)   . Hyperlipidemia   . Hypertension     Surgical History: Past Surgical History:  Procedure Laterality Date  . COLONOSCOPY WITH PROPOFOL N/A 08/16/2018   Procedure: COLONOSCOPY WITH PROPOFOL;  Surgeon: Jonathon Bellows, MD;  [facility] ENDOSCOPY;  Service: Gastroenterology;  Laterality: N/A;    Home Medications:  Allergies as of 11/18/2019   No Known Allergies     Medication List       Accurate as of November 18, 2019  1:31 PM. If you have any questions, ask your nurse or doctor.        STOP taking these medications   tamsulosin 0.4 MG Caps capsule Commonly known as: FLOMAX Stopped by: Abbie Sons, MD     TAKE these medications   alfuzosin 10 MG 24 hr tablet Commonly known as: UROXATRAL Take 1 tablet (10 mg total) by mouth daily with breakfast.   aspirin EC 81 MG tablet Take by mouth.   B-D 3CC LUER-LOK SYR 21GX1-1/2 21G X 1-1/2" 3 ML Misc Generic drug: SYRINGE-NEEDLE (DISP) 3 ML Use this needle to injection   BD SafetyGlide Needle 18G X 1-1/2" Misc Generic drug: NEEDLE  (DISP) 18 G Draw up  Testosterone with this needle   lisinopril 10 MG tablet Commonly known as: ZESTRIL Take 10 mg by mouth daily.   meloxicam 7.5 MG tablet Commonly known as: MOBIC Take 7.5 mg by mouth daily.   sildenafil 20 MG tablet Commonly known as: REVATIO 2-5 tabs 1 hour prior to intercourse   simvastatin 20 MG tablet Commonly known as: ZOCOR Take 20 mg by mouth daily.   testosterone cypionate 200 MG/ML injection Commonly known as: DEPOTESTOSTERONE CYPIONATE Inject 1 mL (200 mg total) into the muscle every 14 (fourteen) days.       Allergies: No Known Allergies  Family History: No family history on file.  Social History:  reports that he quit smoking about 17 years ago. His smoking use included cigarettes. He has never used smokeless tobacco. He reports previous alcohol use. He reports that he does not use drugs.   Physical Exam: BP (!) 154/89   Pulse 89   Ht 5\' 11"  (1.803 m)   Wt 214 lb (97.1 kg)   BMI 29.85 kg/m   Constitutional:  Alert and oriented, No acute distress. HEENT: [place] AT, moist mucus membranes.  Trachea midline, no masses. Chest: No breast mass appreciated, mild gynecomastia Cardiovascular: No clubbing, cyanosis, or edema. Respiratory: Normal respiratory effort, no increased work of breathing. Rectal: 50 cc prostate w/ no induration, nodules or tenderness.  Skin: No rashes, bruises or suspicious lesions. Neurologic: Grossly intact, no focal deficits, moving all  4 extremities. Psychiatric: Normal mood and affect.  Assessment & Plan:    1. Hypogonadism   Testosterone injection today and refill sent Good energy Repeat blood work today for testosterone, hematocrit, and PSA   2. Erectile dysfunction  Been on sildenafil 20 mg for cost-refill sent  3. BPH w/ LUTS  No complaints today  Effectively managed w/ Alfuzosin; refill sent  4. Breast enlargement No breast mass on exam Suspect secondary to TRT Estradiol ordered  Return in about  6 months (around 05/19/2020).  [place] [address] [place], [place] [address] [phone]  I, Lucas Mallow, am acting as a scribe for Dr. Nicki Reaper C. Matias Thurman,  I have reviewed the above documentation for accuracy and completeness, and I agree with the above.    Abbie Sons, MD

## 2019-11-18 ENCOUNTER — Other Ambulatory Visit: Payer: Self-pay

## 2019-11-18 ENCOUNTER — Encounter: Payer: Self-pay | Admitting: Urology

## 2019-11-18 ENCOUNTER — Ambulatory Visit (INDEPENDENT_AMBULATORY_CARE_PROVIDER_SITE_OTHER): Payer: Medicare Other | Admitting: Urology

## 2019-11-18 VITALS — BP 154/89 | HR 89 | Ht 71.0 in | Wt 214.0 lb

## 2019-11-18 DIAGNOSIS — N529 Male erectile dysfunction, unspecified: Secondary | ICD-10-CM

## 2019-11-18 DIAGNOSIS — N401 Enlarged prostate with lower urinary tract symptoms: Secondary | ICD-10-CM | POA: Diagnosis not present

## 2019-11-18 DIAGNOSIS — E291 Testicular hypofunction: Secondary | ICD-10-CM

## 2019-11-18 MED ORDER — TESTOSTERONE CYPIONATE 200 MG/ML IM SOLN: 200.0000 mg | INTRAMUSCULAR | 3 refills | Status: DC

## 2019-11-18 MED ORDER — SILDENAFIL CITRATE 20 MG PO TABS: ORAL_TABLET | ORAL | 3 refills | Status: DC

## 2019-11-18 MED ORDER — TESTOSTERONE CYPIONATE 200 MG/ML IM SOLN
200.0000 mg | Freq: Once | INTRAMUSCULAR | Status: AC
  Administered 2019-11-18: 10:00:00 200 mg via INTRAMUSCULAR

## 2019-11-18 MED ORDER — ALFUZOSIN HCL ER 10 MG PO TB24: 10.0000 mg | ORAL_TABLET | Freq: Every day | ORAL | 11 refills | Status: DC

## 2019-11-18 NOTE — Progress Notes (Signed)
Testosterone IM Injection  Due to Hypogonadism patient is present today for a Testosterone Injection.  Medication: Testosterone Cypionate Dose: 46ml Location: right upper outer buttocks Lot: [id number] Exp:04/2022  Patient tolerated well, no complications were noted  Perrformed by: Verlene Mayer, CMA  Follow up: 2 weeks-scheduled follow up

## 2019-11-19 ENCOUNTER — Telehealth: Payer: Self-pay | Admitting: *Deleted

## 2019-11-19 ENCOUNTER — Other Ambulatory Visit: Payer: Self-pay | Admitting: Urology

## 2019-11-19 LAB — PSA: Prostate Specific Ag, Serum: 1.1 ng/mL (ref 0.0–4.0)

## 2019-11-19 LAB — ESTRADIOL: Estradiol: 49.2 pg/mL — ABNORMAL HIGH (ref 7.6–42.6)

## 2019-11-19 LAB — HEMATOCRIT: Hematocrit: 51.5 % — ABNORMAL HIGH (ref 37.5–51.0)

## 2019-11-19 LAB — TESTOSTERONE: Testosterone: 609 ng/dL (ref 264–916)

## 2019-11-19 MED ORDER — ANASTROZOLE 1 MG PO TABS: 1.0000 mg | ORAL_TABLET | Freq: Every day | ORAL | 3 refills | Status: DC

## 2019-11-19 NOTE — Telephone Encounter (Signed)
Notified patient as instructed, patient pleased. Discussed follow-up appointments, patient agrees  

## 2019-11-19 NOTE — Telephone Encounter (Signed)
-----   Message from Abbie Sons, MD sent at 11/19/2019  1:01 PM EDT ----- PSA stable 1.1.  Hematocrit slightly elevated at 51.5.  Testosterone level was 609 in estradiol level slightly elevated at 49.2  We will start anastrozole to treat slightly elevated estrogen level.  Recommend repeat testosterone, estradiol and hematocrit in 3 months.

## 2019-12-03 ENCOUNTER — Ambulatory Visit (INDEPENDENT_AMBULATORY_CARE_PROVIDER_SITE_OTHER): Payer: Medicare Other | Admitting: *Deleted

## 2019-12-03 ENCOUNTER — Other Ambulatory Visit: Payer: Self-pay

## 2019-12-03 DIAGNOSIS — E291 Testicular hypofunction: Secondary | ICD-10-CM | POA: Diagnosis not present

## 2019-12-03 MED ORDER — TESTOSTERONE CYPIONATE 200 MG/ML IM SOLN
200.0000 mg | Freq: Once | INTRAMUSCULAR | Status: AC
  Administered 2019-12-03: 200 mg via INTRAMUSCULAR

## 2019-12-03 NOTE — Progress Notes (Signed)
Testosterone IM Injection  Due to Hypogonadism patient is present today for a Testosterone Injection.  Medication: Testosterone Cypionate Dose: 81ml  Location: right upper outer buttocks Lot: [id number] Exp:01/2022  Patient tolerated well, no complications were noted  Preformed by: Gaspar Cola CMA   Follow up: Return in 2 weeks. Next visit he would like to learn how to give hisself an injection.

## 2019-12-17 ENCOUNTER — Ambulatory Visit (INDEPENDENT_AMBULATORY_CARE_PROVIDER_SITE_OTHER): Payer: Medicare Other

## 2019-12-17 ENCOUNTER — Other Ambulatory Visit: Payer: Self-pay

## 2019-12-17 ENCOUNTER — Ambulatory Visit: Payer: Self-pay

## 2019-12-17 DIAGNOSIS — E291 Testicular hypofunction: Secondary | ICD-10-CM | POA: Diagnosis not present

## 2019-12-17 MED ORDER — TESTOSTERONE CYPIONATE 200 MG/ML IM SOLN
200.0000 mg | Freq: Once | INTRAMUSCULAR | Status: AC
  Administered 2019-12-17: 200 mg via INTRAMUSCULAR

## 2019-12-17 NOTE — Progress Notes (Signed)
Testosterone IM Injection  Due to Hypogonadism patient is present today for a Testosterone Injection.  Medication: Testosterone Cypionate Dose: 200mg /70mL Location: left upper outer buttocks Lot: [id number] Exp:04/2022  Patient tolerated well, no complications were noted  Preformed by: Bradly Bienenstock, CMA  Follow up: 2 weeks (12/31/2019) Patient wanted to try self injection today however he changed his mind. He is looking into having his PCP administer his testosterone as it is only right down the road from his house. He agreed to cancel his next injection appt if so, otherwise he will continue with nurse injections.

## 2019-12-31 ENCOUNTER — Ambulatory Visit: Payer: Self-pay

## 2020-01-06 ENCOUNTER — Ambulatory Visit: Payer: Self-pay

## 2020-02-17 ENCOUNTER — Other Ambulatory Visit: Payer: Self-pay

## 2020-02-17 DIAGNOSIS — E291 Testicular hypofunction: Secondary | ICD-10-CM

## 2020-02-18 ENCOUNTER — Other Ambulatory Visit: Payer: Self-pay

## 2020-02-18 ENCOUNTER — Other Ambulatory Visit: Payer: Medicare Other

## 2020-02-18 DIAGNOSIS — E291 Testicular hypofunction: Secondary | ICD-10-CM

## 2020-02-19 ENCOUNTER — Telehealth: Payer: Self-pay | Admitting: Urology

## 2020-02-19 LAB — HEMATOCRIT: Hematocrit: 45.8 % (ref 37.5–51.0)

## 2020-02-19 LAB — ESTRADIOL: Estradiol: <5 pg/mL — ABNORMAL LOW (ref 7.6–42.6)

## 2020-02-19 LAB — TESTOSTERONE: Testosterone: 1044 ng/dL — ABNORMAL HIGH (ref 264–916)

## 2020-02-19 NOTE — Telephone Encounter (Signed)
Estradiol level much better at < 5.  Hematocrit better 48.5.  Testosterone was elevated at 1044.  When was his last injection?

## 2020-02-20 NOTE — Telephone Encounter (Signed)
Testosterone injection was 02/11/2020. Notified patient as instructed, patient pleased. Discussed follow-up appointments, patient agrees

## 2020-03-23 ENCOUNTER — Other Ambulatory Visit: Payer: Self-pay | Admitting: *Deleted

## 2020-03-23 DIAGNOSIS — E291 Testicular hypofunction: Secondary | ICD-10-CM

## 2020-03-23 NOTE — Telephone Encounter (Signed)
Patient called and left a vmail to discuss follow up

## 2020-03-24 MED ORDER — TESTOSTERONE CYPIONATE 200 MG/ML IM SOLN: 200.0000 mg | INTRAMUSCULAR | 3 refills | Status: DC

## 2020-04-02 ENCOUNTER — Other Ambulatory Visit: Payer: Self-pay

## 2020-04-02 MED ORDER — ANASTROZOLE 1 MG PO TABS
1.0000 mg | ORAL_TABLET | Freq: Every day | ORAL | 3 refills | Status: DC
Start: 2020-04-02 — End: 2020-06-29

## 2020-04-09 ENCOUNTER — Telehealth: Payer: Self-pay

## 2020-04-09 DIAGNOSIS — E291 Testicular hypofunction: Secondary | ICD-10-CM

## 2020-04-09 NOTE — Telephone Encounter (Signed)
Patient's PCP [facility] office called wanting to discuss dosing on testosterone and getting script filled at their office.   please return call [phone] Fax: [phone]

## 2020-04-10 MED ORDER — TESTOSTERONE CYPIONATE 200 MG/ML IM SOLN: 140.0000 mg | INTRAMUSCULAR | 3 refills | Status: DC

## 2020-04-10 NOTE — Telephone Encounter (Signed)
Faxed to [facility] .

## 2020-04-10 NOTE — Telephone Encounter (Signed)
Recommend testosterone dose 140 mg (0.7 cc) every 2 weeks (testosterone cypionate 200 mg/mL)  Do they just want to fill the Rx ordered today want to monitor patient?

## 2020-04-20 IMAGING — CT CT CHEST W/O CM
2 of 4 series · 15 of 36 positions shown, 18 images · non-contrast
Comparison: Chest CT dated 10/29/2018

CLINICAL DATA: Follow-up of pulmonary nodule.

EXAM:
CT CHEST WITHOUT CONTRAST
TECHNIQUE: Multidetector CT imaging of the chest was performed following the
standard protocol without IV contrast.

[Series 2: chest · axial · 0.77mm/px · z∈[-1224,-916]mm · 12 of 182 slices shown, 15 images]
[im 14/182  mediastinal]
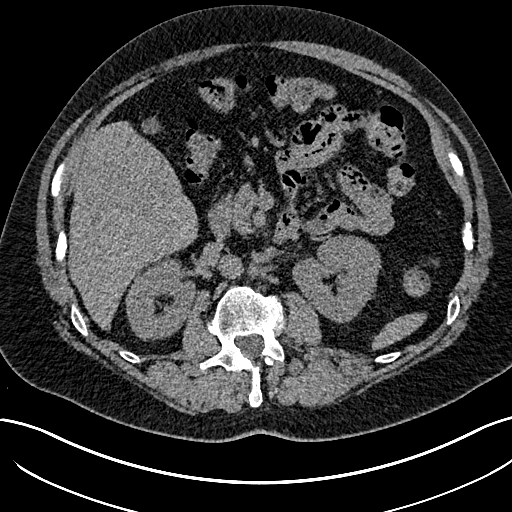
[im 14/182  lung]
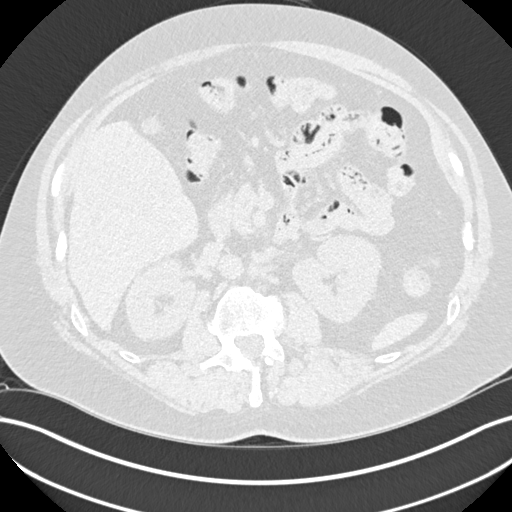
[im 28/182  lung]
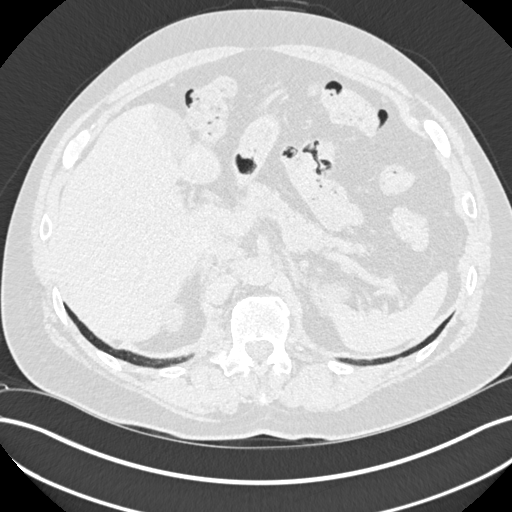
[im 42/182  lung]
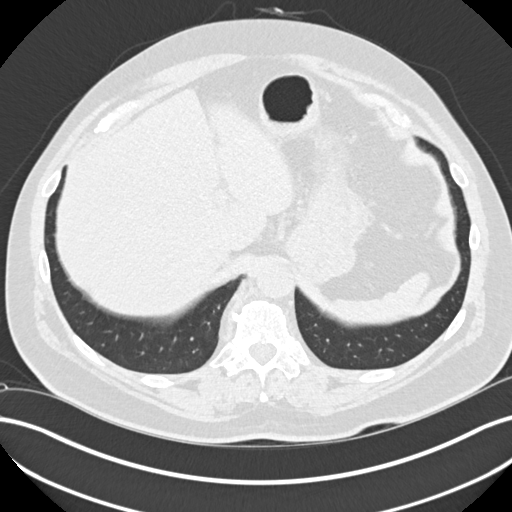
[im 56/182  lung]
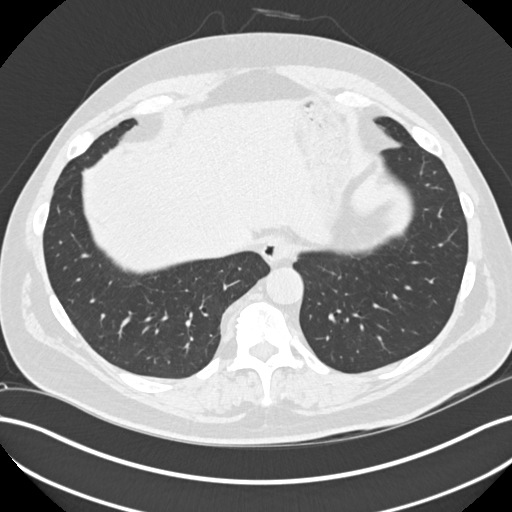
[im 70/182  mediastinal]
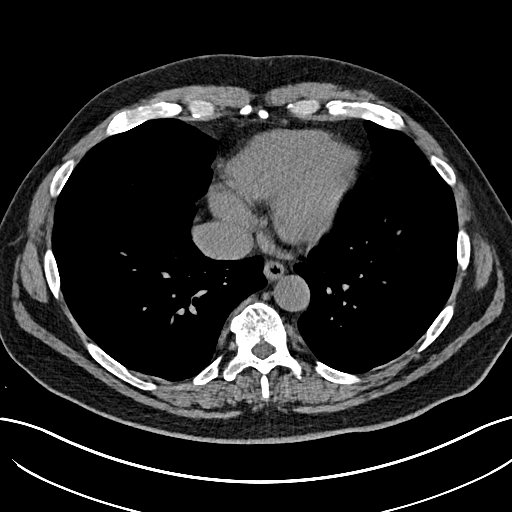
[im 70/182  lung]
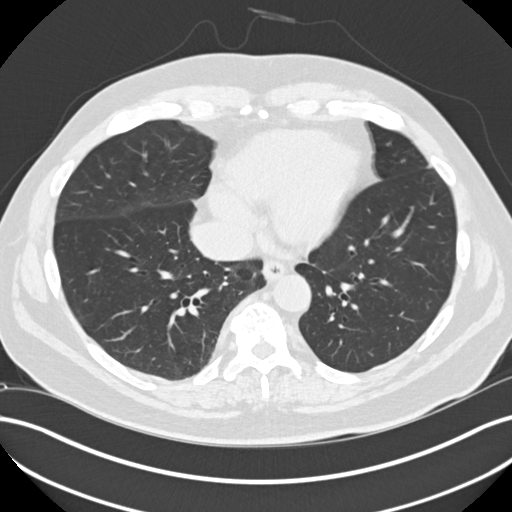
[im 84/182  lung]
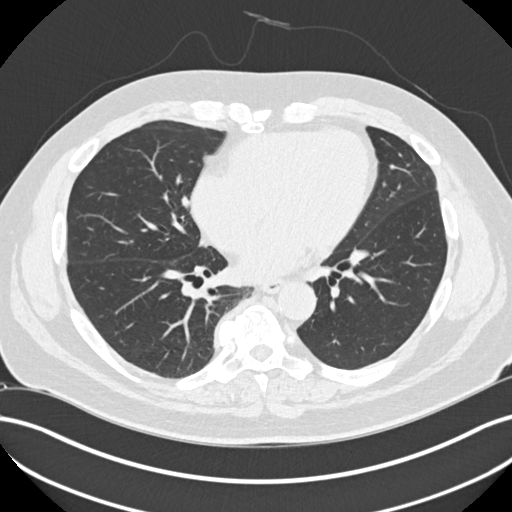
[im 98/182  lung]
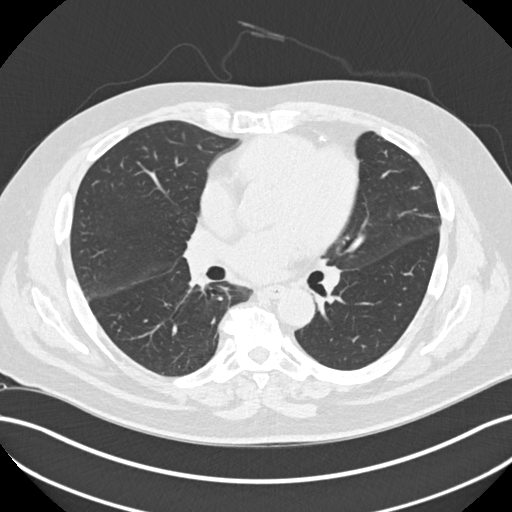
[im 112/182  lung]
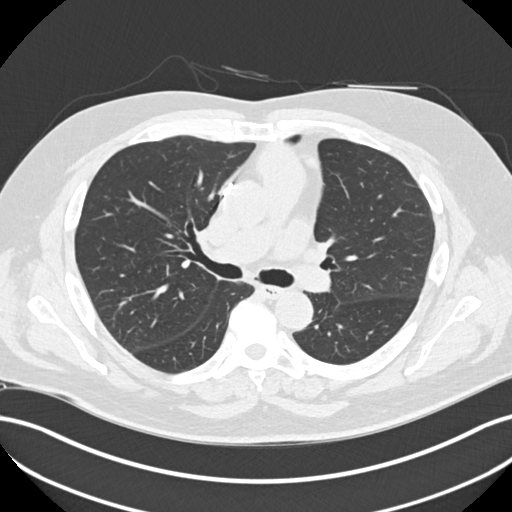
[im 126/182  mediastinal]
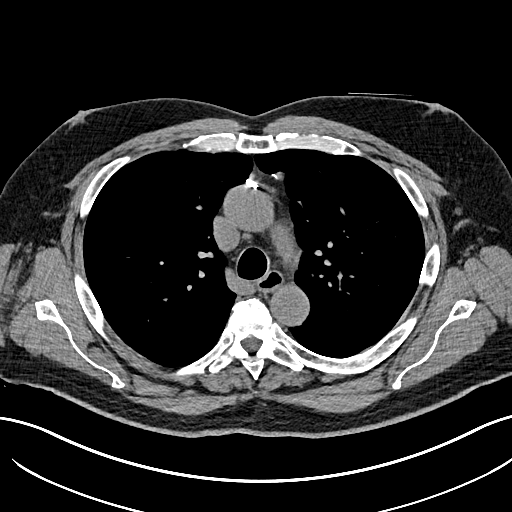
[im 126/182  lung]
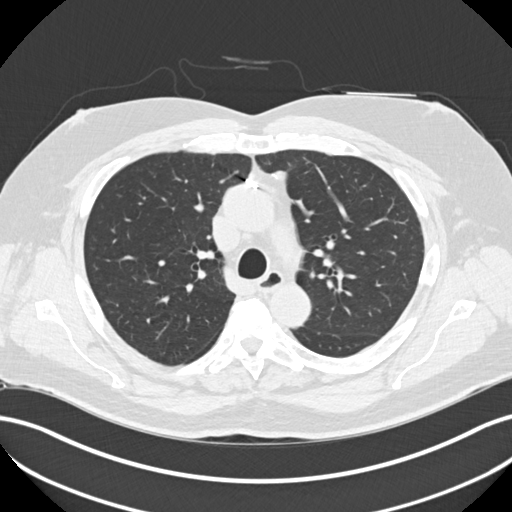
[im 140/182  lung]
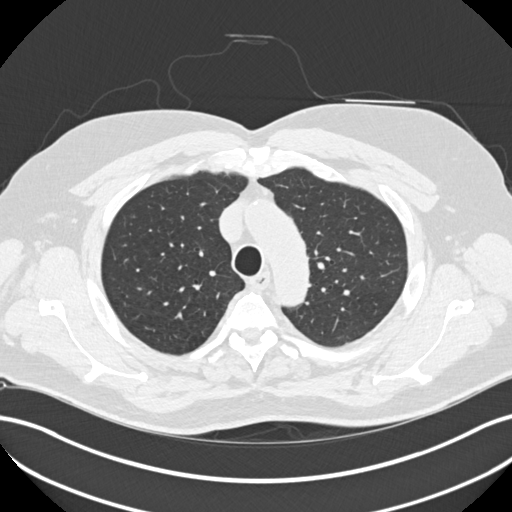
[im 154/182  lung]
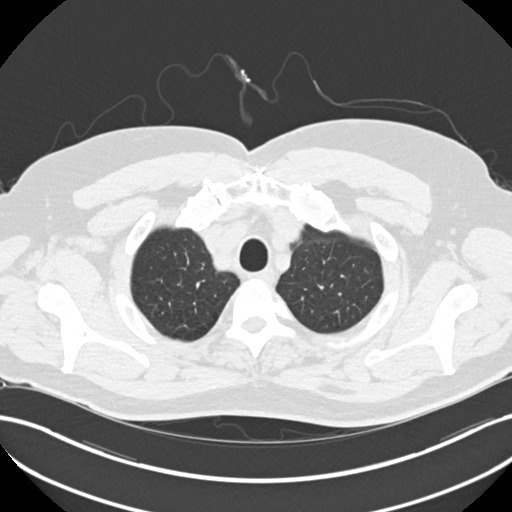
[im 168/182  lung]
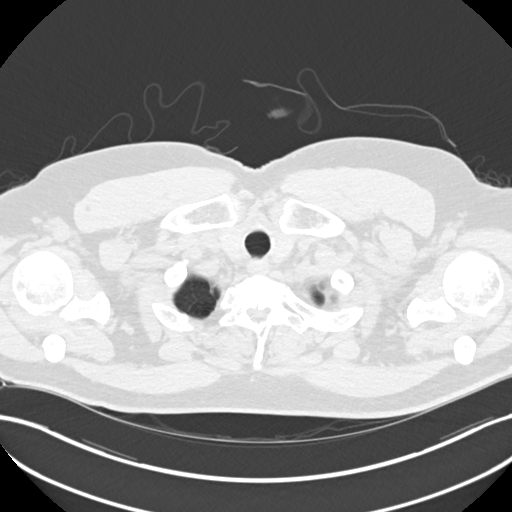

[Series 5: coronals chest · coronal · 0.71mm/px · 3 of 156 slices shown]
[im 32/156  lung]
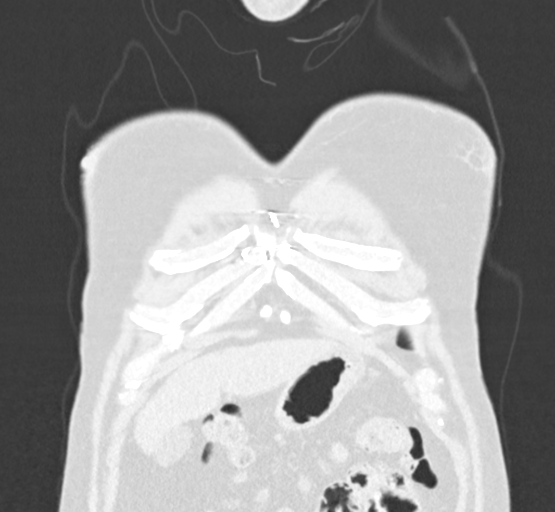
[im 63/156  lung]
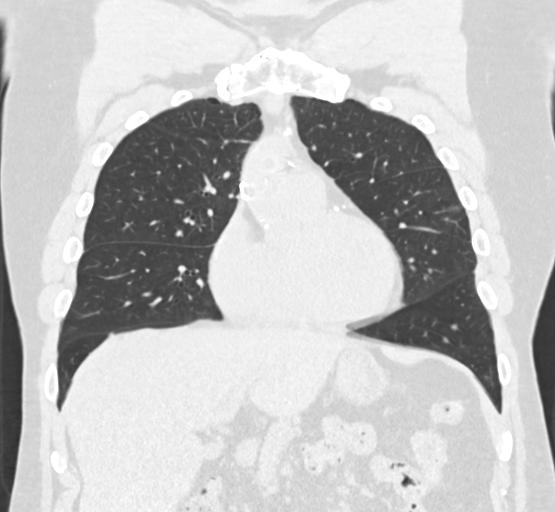
[im 94/156  lung]
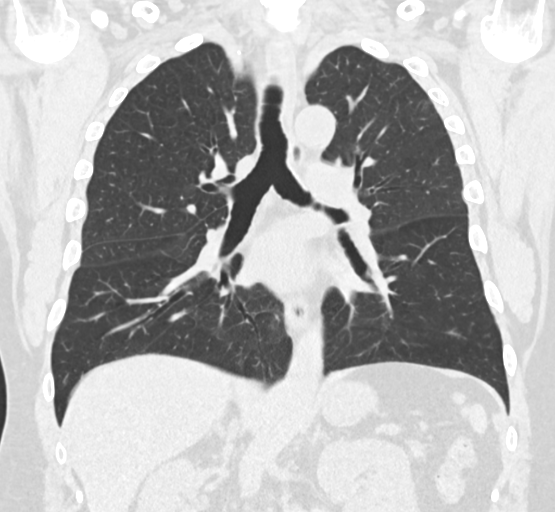

[15 of 36 positions shown; findings below may reference images not displayed]

FINDINGS: Cardiovascular: Aortic atherosclerosis. CABG. Heart size is normal.
No pericardial effusion.

Mediastinum/Nodes: No hilar or mediastinal or axillary adenopathy.
Small stable nodule in the right lobe of the thyroid gland. Trachea
and esophagus appear normal.

Lungs/Pleura: There is a focal area of stable linear scarring in the
superior aspect of the left upper lobe, unchanged since October 29, 2018. Lungs are otherwise clear. No effusions.

Upper Abdomen: No acute abnormality. No change since the prior
study. Contour of the posterior aspect of the body of the pancreas
is unchanged. This is not felt to be significant.

Musculoskeletal: No chest wall mass or suspicious bone lesions
identified.
IMPRESSION: No acute abnormalities. Stable linear scarring in the superior
aspect of the left upper lobe. One additional follow-up CT scan
without contrast between [place] and May 2020 is recommended to
determine ongoing stability.

Aortic Atherosclerosis ([id number].T).

## 2020-04-21 ENCOUNTER — Ambulatory Visit
Admission: RE | Admit: 2020-04-21 | Discharge: 2020-04-21 | Disposition: A | Discharge status: Home / Self Care | Payer: Medicare Other | Source: Ambulatory Visit | Attending: Internal Medicine | Admitting: Internal Medicine

## 2020-04-21 ENCOUNTER — Other Ambulatory Visit: Payer: Self-pay

## 2020-04-21 DIAGNOSIS — R918 Other nonspecific abnormal finding of lung field: Secondary | ICD-10-CM | POA: Insufficient documentation

## 2020-04-28 ENCOUNTER — Ambulatory Visit (INDEPENDENT_AMBULATORY_CARE_PROVIDER_SITE_OTHER): Payer: Medicare Other | Admitting: Internal Medicine

## 2020-04-28 ENCOUNTER — Other Ambulatory Visit: Payer: Self-pay

## 2020-04-28 ENCOUNTER — Encounter: Payer: Self-pay | Admitting: Internal Medicine

## 2020-04-28 VITALS — BP 148/88 | HR 78 | Temp 98.4°F | Ht 71.0 in | Wt 183.2 lb

## 2020-04-28 DIAGNOSIS — R9389 Abnormal findings on diagnostic imaging of other specified body structures: Secondary | ICD-10-CM

## 2020-04-28 DIAGNOSIS — R918 Other nonspecific abnormal finding of lung field: Secondary | ICD-10-CM | POA: Diagnosis not present

## 2020-04-28 NOTE — Progress Notes (Signed)
Name: Jerome Nichols MRN: [id number] DOB: 1950-12-09      CHIEF COMPLAINT:  Follow up abnormal CT chest    HISTORY OF PRESENT ILLNESS: CT of the chest September 2021 Personally reviewed with patient today No obvious interval changes that suggest malignancy Findings relayed to patient  PFTs also reviewed with patient again Spirometry is within normal limits No obvious obstructive or restrictive pattern  CT of the chest reviewed showed nodular densities since 2012 This is most likely benign finding However patient has extensive smoking history and I have explained to him that patient will probably need annual CT scans to assess for lung cancer   No exacerbation at this time No evidence of heart failure at this time No evidence or signs of infection at this time No respiratory distress No fevers, chills, nausea, vomiting, diarrhea No evidence of lower extremity edema No evidence hemoptysis    PAST MEDICAL HISTORY :   has a past medical history of BPH (benign prostatic hyperplasia), Hyperlipidemia, and Hypertension.  has a past surgical history that includes Colonoscopy with propofol (N/A, 08/16/2018). Prior to Admission medications   Medication Sig Start Date End Date Taking? Authorizing Provider  aspirin EC 81 MG tablet Take by mouth.   Yes [provider]  simvastatin (ZOCOR) 20 MG tablet Take 20 mg by mouth daily.  10/15/13  Yes [provider]  testosterone cypionate (DEPOTESTOSTERONE CYPIONATE) 200 MG/ML injection Inject 1 mL (200 mg total) into the muscle every 14 (fourteen) days. 08/29/18  Yes Stoioff, Ronda Fairly, MD  omeprazole (PRILOSEC) 20 MG capsule TAKE ONE CAPSULE BY MOUTH EVERY MORNING 30 MINUTES BEFORE BREAKFAST 01/07/19   [provider]  sildenafil (VIAGRA) 50 MG tablet TAKE ONE TABLET BY MOUTH ONCE DAILY AS NEEDED FOR ERECTILE DYSFUNCTION 11/21/18   [provider]  simvastatin (ZOCOR) 10 MG tablet TAKE ONE TABLET BY MOUTH  EVERY EVENING FOR CHOLESTEROL 01/14/19   [provider]   No Known Allergies  FAMILY HISTORY:  HTN  SOCIAL HISTORY:  reports that he quit smoking about 17 years ago. His smoking use included cigarettes. He has never used smokeless tobacco. He reports previous alcohol use. He reports that he does not use drugs.    Review of Systems:  Gen:  Denies  fever, sweats, chills weight loss  HEENT: Denies blurred vision, double vision, ear pain, eye pain, hearing loss, nose bleeds, sore throat Cardiac:  No dizziness, chest pain or heaviness, chest tightness,edema, No JVD Resp:   No cough, -sputum production, -shortness of breath,-wheezing, -hemoptysis,  Gi: Denies swallowing difficulty, stomach pain, nausea or vomiting, diarrhea, constipation, bowel incontinence Gu:  Denies bladder incontinence, burning urine Ext:   Denies Joint pain, stiffness or swelling Skin: Denies  skin rash, easy bruising or bleeding or hives Endoc:  Denies polyuria, polydipsia , polyphagia or weight change Psych:   Denies depression, insomnia or hallucinations  Other:  All other systems negative     BP (!) 148/88 (BP Location: Left Arm, Patient Position: Sitting, Cuff Size: Normal)   Pulse 78   Temp 98.4 F (36.9 C) (Temporal)   Ht 5\' 11"  (1.803 m)   Wt 183 lb 3.2 oz (83.1 kg)   SpO2 98%   BMI 25.55 kg/m    Physical Examination:   General Appearance: No distress  Neuro:without focal findings,  speech normal,  HEENT: PERRLA, EOM intact.   Pulmonary: normal breath sounds, No wheezing.  CardiovascularNormal S1,S2.  No m/r/g.   Abdomen: Benign, Soft, non-tender.  Renal:  No costovertebral tenderness  GU:  Not performed at this time. Endoc: No evident thyromegaly Skin:   warm, no rashes, no ecchymosis  Extremities: normal, no cyanosis, clubbing. PSYCHIATRIC: Mood, affect within normal limits.   ALL OTHER ROS ARE NEGATIVE   IMAGING   CT CHEST 04/2020   The CT chest  was Independently  Reviewed By Me Today There is slight scarring in the extreme apices, stable. Scarring in the anterior segment left upper lobe is stable. Slight nodularity along this area of scarring measures 1.1 x 0.5 cm, stable compared to prior studies. There is no change in this area compared to prior CT examinations. There is no edema or airspace opacity. No pleural effusions are evident. Slight lower lobe bronchiectatic change bilaterally is stable.    ASSESSMENT AND PLAN SYNOPSIS  Abnormal CT chest nodular opacity since 2012 but some have enlarged since October 2020 Most recent CT chest September 2021 does not reveal any significant changes in stability of scarring Recommend annual CT scans for lung cancer screening  Patient does not have any respiratory symptoms at this time No indication for antibiotics or prednisone at this time      COVID-19 EDUCATION: The signs and symptoms of COVID-19 were discussed with the patient and how to seek care for testing.  The importance of social distancing was discussed today. Hand Washing Techniques and avoid touching face was advised.  MEDICATION ADJUSTMENTS/LABS AND TESTS ORDERED: Follow up CT chest in 1 year  CURRENT MEDICATIONS REVIEWED AT LENGTH WITH PATIENT TODAY   Patient satisfied with Plan of action and management. All questions answered Follow up in 1 year  Time spent 15 minutes   Tylar Merendino Patricia Pesa, M.D.  [facility]  Medical Director [place] Director [facility] Cardio-Pulmonary Department

## 2020-04-28 NOTE — Patient Instructions (Signed)
Follow-up CT chest 1 year Avoid secondhand smoke exposure

## 2020-04-29 ENCOUNTER — Ambulatory Visit: Payer: Medicare Other | Admitting: Internal Medicine

## 2020-05-19 ENCOUNTER — Other Ambulatory Visit: Payer: Self-pay

## 2020-05-19 ENCOUNTER — Other Ambulatory Visit: Payer: Medicare Other

## 2020-05-19 DIAGNOSIS — E291 Testicular hypofunction: Secondary | ICD-10-CM

## 2020-05-20 ENCOUNTER — Telehealth: Payer: Self-pay | Admitting: *Deleted

## 2020-05-20 LAB — TESTOSTERONE: Testosterone: 704 ng/dL (ref 264–916)

## 2020-05-20 LAB — HEMATOCRIT: Hematocrit: 48.4 % (ref 37.5–51.0)

## 2020-05-20 NOTE — Telephone Encounter (Signed)
Notified patient as instructed, patient pleased. Discussed follow-up appointments, patient agrees  

## 2020-05-20 NOTE — Telephone Encounter (Signed)
-----   Message from Abbie Sons, MD sent at 05/20/2020  7:20 AM EDT ----- Testosterone level looks good at 704, hematocrit normal 48.4.  Follow-up as scheduled

## 2020-06-29 ENCOUNTER — Other Ambulatory Visit: Payer: Self-pay | Admitting: *Deleted

## 2020-06-29 DIAGNOSIS — N401 Enlarged prostate with lower urinary tract symptoms: Secondary | ICD-10-CM

## 2020-06-29 MED ORDER — ANASTROZOLE 1 MG PO TABS: 1.0000 mg | ORAL_TABLET | Freq: Every day | ORAL | 3 refills | Status: DC

## 2020-08-03 ENCOUNTER — Other Ambulatory Visit: Payer: Self-pay | Admitting: *Deleted

## 2020-08-03 ENCOUNTER — Other Ambulatory Visit: Payer: Self-pay | Admitting: Urology

## 2020-08-03 DIAGNOSIS — N401 Enlarged prostate with lower urinary tract symptoms: Secondary | ICD-10-CM

## 2020-08-03 DIAGNOSIS — E291 Testicular hypofunction: Secondary | ICD-10-CM

## 2020-08-03 MED ORDER — ANASTROZOLE 1 MG PO TABS: 1.0000 mg | ORAL_TABLET | Freq: Every day | ORAL | 3 refills | Status: DC

## 2020-08-03 MED ORDER — TESTOSTERONE CYPIONATE 200 MG/ML IM SOLN: 140.0000 mg | INTRAMUSCULAR | 3 refills | Status: DC

## 2020-08-31 ENCOUNTER — Other Ambulatory Visit: Payer: Self-pay | Admitting: Family Medicine

## 2020-08-31 DIAGNOSIS — E291 Testicular hypofunction: Secondary | ICD-10-CM

## 2020-08-31 MED ORDER — TESTOSTERONE CYPIONATE 200 MG/ML IM SOLN: 140.0000 mg | INTRAMUSCULAR | 3 refills | Status: DC

## 2020-08-31 NOTE — Telephone Encounter (Signed)
Patient called and states he has been unable to get his Testosterone filled. It was sent to [facility] however it states on the prescription transaction that is had failed. I printed another RX and it has been faxed to [facility] per patient.

## 2020-11-09 ENCOUNTER — Other Ambulatory Visit: Payer: Self-pay | Admitting: *Deleted

## 2020-11-09 DIAGNOSIS — E291 Testicular hypofunction: Secondary | ICD-10-CM

## 2020-11-09 DIAGNOSIS — N401 Enlarged prostate with lower urinary tract symptoms: Secondary | ICD-10-CM

## 2020-11-12 ENCOUNTER — Other Ambulatory Visit: Payer: Self-pay

## 2020-11-12 ENCOUNTER — Other Ambulatory Visit: Payer: Medicare Other

## 2020-11-12 DIAGNOSIS — N401 Enlarged prostate with lower urinary tract symptoms: Secondary | ICD-10-CM

## 2020-11-12 DIAGNOSIS — E291 Testicular hypofunction: Secondary | ICD-10-CM

## 2020-11-13 LAB — TESTOSTERONE: Testosterone: 948 ng/dL — ABNORMAL HIGH (ref 264–916)

## 2020-11-13 LAB — PSA: Prostate Specific Ag, Serum: 1 ng/mL (ref 0.0–4.0)

## 2020-11-19 ENCOUNTER — Encounter: Payer: Self-pay | Admitting: Urology

## 2020-11-19 ENCOUNTER — Other Ambulatory Visit: Payer: Self-pay

## 2020-11-19 ENCOUNTER — Ambulatory Visit (INDEPENDENT_AMBULATORY_CARE_PROVIDER_SITE_OTHER): Payer: Medicare Other | Admitting: Urology

## 2020-11-19 VITALS — BP 149/88 | HR 75 | Ht 71.0 in | Wt 188.0 lb

## 2020-11-19 DIAGNOSIS — N401 Enlarged prostate with lower urinary tract symptoms: Secondary | ICD-10-CM | POA: Diagnosis not present

## 2020-11-19 DIAGNOSIS — E291 Testicular hypofunction: Secondary | ICD-10-CM | POA: Diagnosis not present

## 2020-11-19 DIAGNOSIS — N5201 Erectile dysfunction due to arterial insufficiency: Secondary | ICD-10-CM | POA: Diagnosis not present

## 2020-11-19 NOTE — Progress Notes (Addendum)
11/19/2020 1:19 PM   Jerome Nichols [birth year] [id number]  Referring provider: Ranae Plumber, [place],  [place] [address]  Chief Complaint  Patient presents with  . Hypogonadism     Urologic history: 1.Hypogonadism -Testosterone cypionate 140 mg every 2 weeks  - Anastrozole 1 mg daily for elevated estradiol  2.Erectile dysfunction -On sildenafil 50 mg  3.BPH with lower urinary tract symptoms  -On alfuzosin  HPI: 70 y.o. male presents for annual follow-up.   Injecting 140 mg every 2 weeks  Labs 11/12/2020 testosterone 948 and PSA stable 1.0  No bothersome LUTS   mild left gynecomastia which he states has worsened  PMH: Past Medical History:  Diagnosis Date  . BPH (benign prostatic hyperplasia)   . Hyperlipidemia   . Hypertension     Surgical History: Past Surgical History:  Procedure Laterality Date  . COLONOSCOPY WITH PROPOFOL N/A 08/16/2018   Procedure: COLONOSCOPY WITH PROPOFOL;  Surgeon: Jonathon Bellows, MD;  Location: [facility] ENDOSCOPY;  Service: Gastroenterology;  Laterality: N/A;    Home Medications:  Allergies as of 11/19/2020   No Known Allergies     Medication List       Accurate as of November 19, 2020  1:19 PM. If you have any questions, ask your nurse or doctor.        STOP taking these medications   meloxicam 7.5 MG tablet Commonly known as: MOBIC Stopped by: Abbie Sons, MD     TAKE these medications   alfuzosin 10 MG 24 hr tablet Commonly known as: UROXATRAL Take 1 tablet (10 mg total) by mouth daily with breakfast.   anastrozole 1 MG tablet Commonly known as: ARIMIDEX Take 1 tablet (1 mg total) by mouth daily.   aspirin EC 81 MG tablet Take by mouth.   B-D 3CC LUER-LOK SYR 21GX1-1/2 21G X 1-1/2" 3 ML Misc Generic drug: SYRINGE-NEEDLE (DISP) 3 ML Use this needle to injection   BD SafetyGlide Needle 18G X 1-1/2" Misc Generic drug: NEEDLE (DISP) 18 G Draw up   Testosterone with this needle   lisinopril 10 MG tablet Commonly known as: ZESTRIL Take 10 mg by mouth daily.   sildenafil 20 MG tablet Commonly known as: REVATIO 2-5 tabs 1 hour prior to intercourse   simvastatin 20 MG tablet Commonly known as: ZOCOR Take 20 mg by mouth daily.   testosterone cypionate 200 MG/ML injection Commonly known as: DEPOTESTOSTERONE CYPIONATE Inject 0.7 mLs (140 mg total) into the muscle every 14 (fourteen) days.       Allergies: No Known Allergies  Family History: History reviewed. No pertinent family history.  Social History:  reports that he quit smoking about 18 years ago. His smoking use included cigarettes. He has never used smokeless tobacco. He reports previous alcohol use. He reports that he does not use drugs.   Physical Exam: BP (!) 149/88   Pulse 75   Ht 5\' 11"  (1.803 m)   Wt 188 lb (85.3 kg)   BMI 26.22 kg/m   Constitutional:  Alert and oriented, No acute distress. HEENT: [place] AT, moist mucus membranes.  Trachea midline, no masses. Cardiovascular: No clubbing, cyanosis, or edema. Respiratory: Normal respiratory effort, no increased work of breathing. Chest: Mild left gynecomastia, no palpable mass GU: Prostate 50 g, smooth without nodules Lymph: No cervical or inguinal lymphadenopathy. Skin: No rashes, bruises or suspicious lesions. Neurologic: Grossly intact, no focal deficits, moving all 4 extremities. Psychiatric: Normal mood and affect.   Assessment & Plan:  1.  Hypogonadism  Good energy level and symptomatic improvement on TRT  A hematocrit was not drawn however he states he had recent blood work with his PCP and will request recent hematocrit results  51-month lab visit for testosterone/hematocrit  1 year follow-up office visit with testosterone, estradiol, PSA, hematocrit  2.  BPH with LUTS  Stable on alfuzosin  3.  Erectile dysfunction  Stable on sildenafil  He states he did not need any medication  refills at this time and will call back when needed  Addendum: Hematocrit 08/04/2020 was 44%   Abbie Sons, MD  [place] [address], [place], [place] [address] [phone]

## 2020-11-20 ENCOUNTER — Other Ambulatory Visit: Payer: Self-pay | Admitting: Family Medicine

## 2020-12-01 ENCOUNTER — Other Ambulatory Visit: Payer: Self-pay | Admitting: Family Medicine

## 2020-12-01 DIAGNOSIS — N401 Enlarged prostate with lower urinary tract symptoms: Secondary | ICD-10-CM

## 2020-12-01 MED ORDER — ANASTROZOLE 1 MG PO TABS: 1.0000 mg | ORAL_TABLET | Freq: Every day | ORAL | 3 refills | Status: DC

## 2021-02-02 ENCOUNTER — Other Ambulatory Visit: Payer: Self-pay | Admitting: *Deleted

## 2021-02-02 DIAGNOSIS — E291 Testicular hypofunction: Secondary | ICD-10-CM

## 2021-02-03 ENCOUNTER — Other Ambulatory Visit: Payer: Self-pay | Admitting: *Deleted

## 2021-02-03 DIAGNOSIS — E291 Testicular hypofunction: Secondary | ICD-10-CM

## 2021-02-03 MED ORDER — TESTOSTERONE CYPIONATE 200 MG/ML IM SOLN: 140.0000 mg | INTRAMUSCULAR | 3 refills | Status: DC

## 2021-02-03 NOTE — Addendum Note (Signed)
Addended by: Despina Hidden on: 02/03/2021 01:50 PM   Modules accepted: Orders

## 2021-02-04 MED ORDER — TESTOSTERONE CYPIONATE 200 MG/ML IM SOLN: 140.0000 mg | INTRAMUSCULAR | 3 refills | Status: DC

## 2021-04-06 IMAGING — CT CT CHEST W/O CM
2 of 4 series · 15 of 36 positions shown, 18 images · non-contrast
Comparison: Chest CT May 06, 2019 and October 29, 2018

CLINICAL DATA: Lung scarring with previous apparent nodularity

EXAM:
CT CHEST WITHOUT CONTRAST
TECHNIQUE: Multidetector CT imaging of the chest was performed following the
standard protocol without IV contrast.

[Series 2: chest 2.00 · axial · 0.71mm/px · z∈[-1220,-912]mm · 12 of 183 slices shown, 15 images]
[im 15/183  mediastinal]
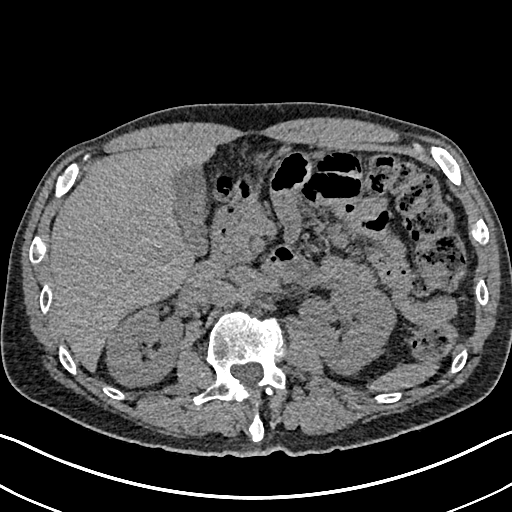
[im 15/183  lung]
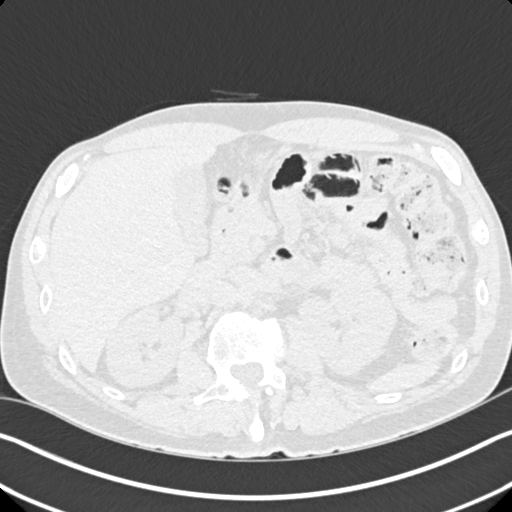
[im 29/183  lung]
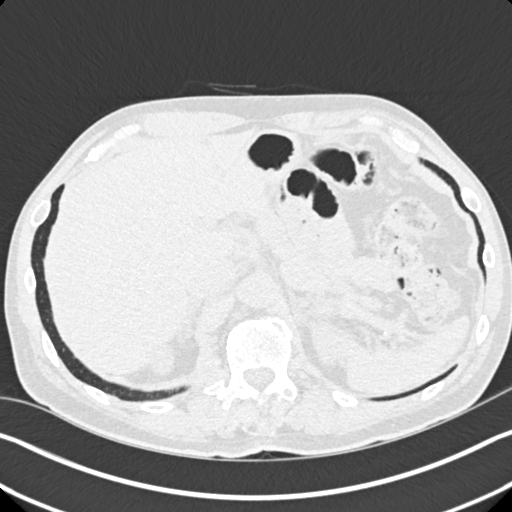
[im 43/183  lung]
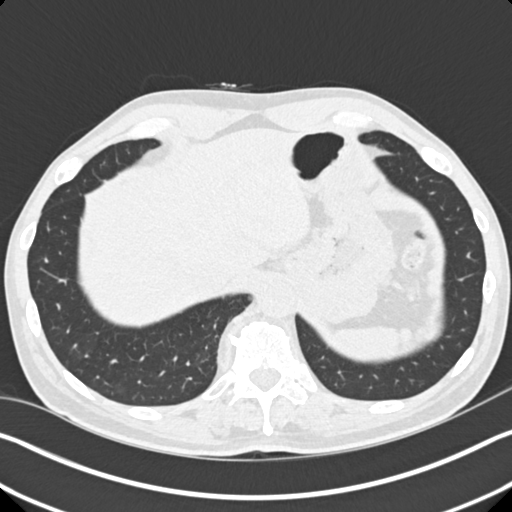
[im 57/183  lung]
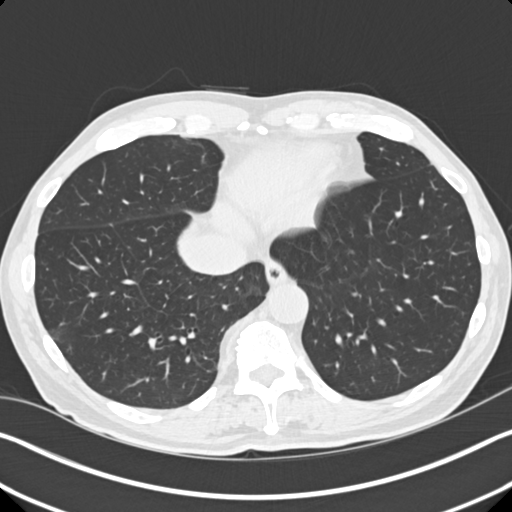
[im 71/183  mediastinal]
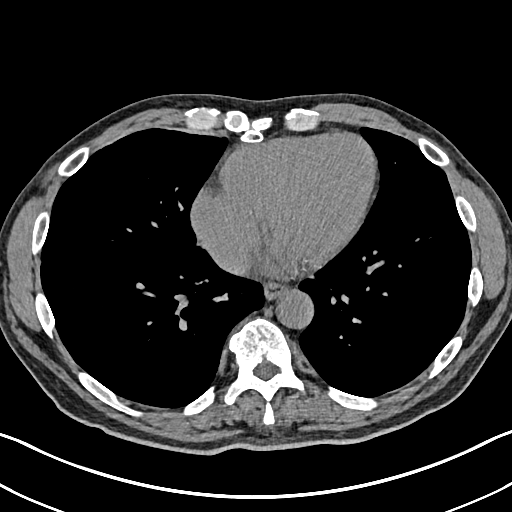
[im 71/183  lung]
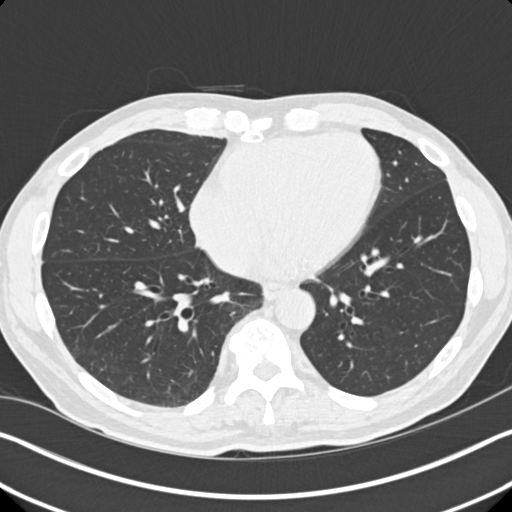
[im 85/183  lung]
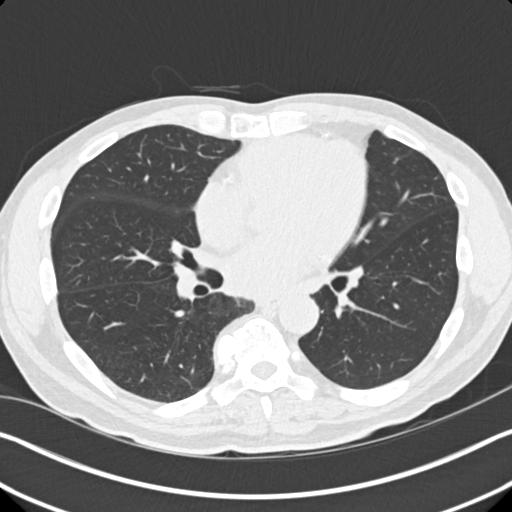
[im 99/183  lung]
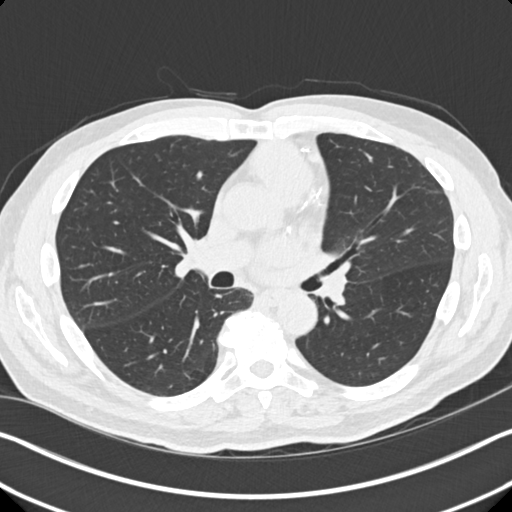
[im 113/183  lung]
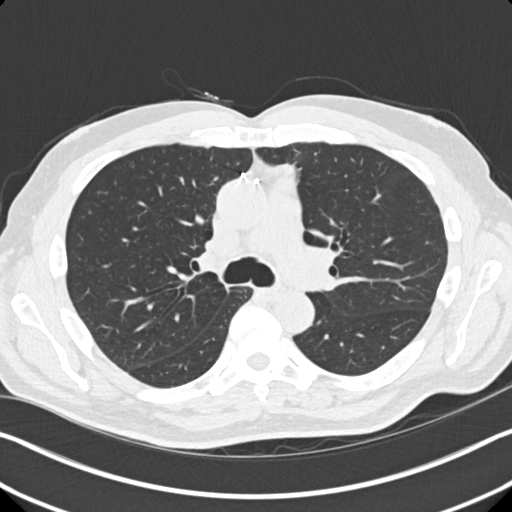
[im 127/183  mediastinal]
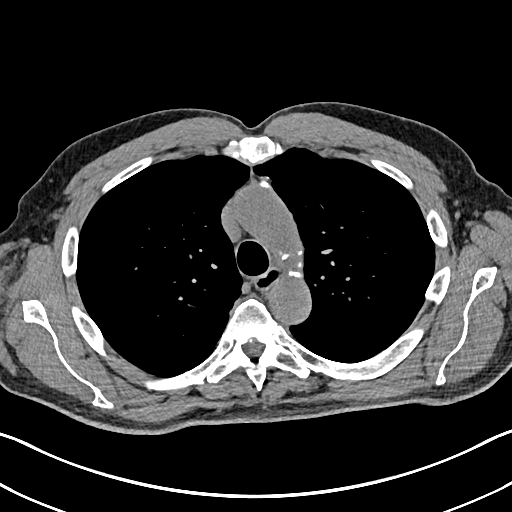
[im 127/183  lung]
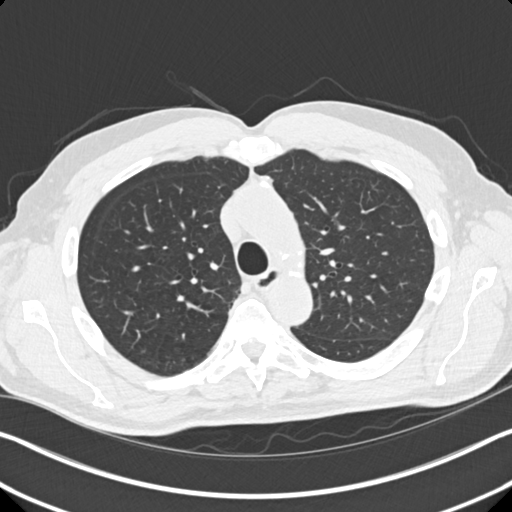
[im 141/183  lung]
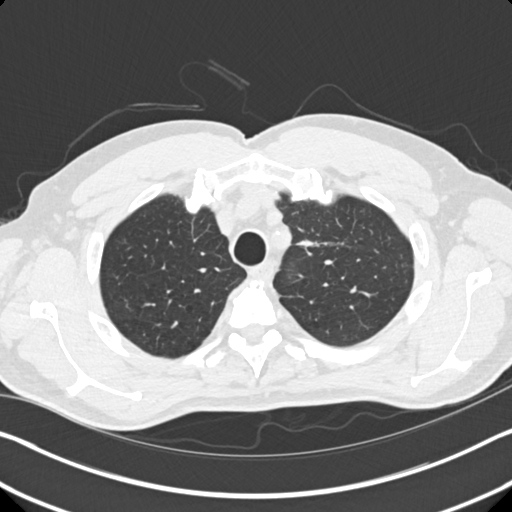
[im 155/183  lung]
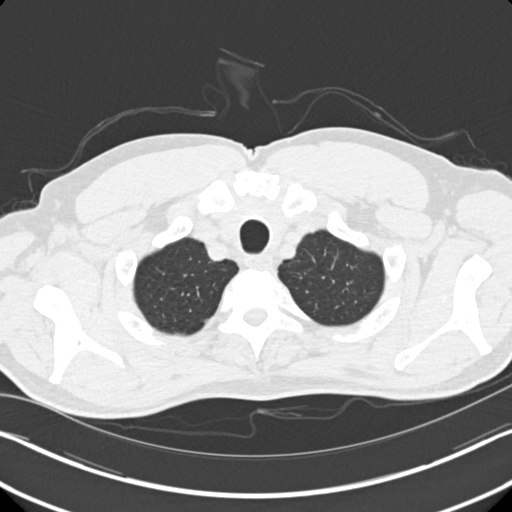
[im 169/183  lung]
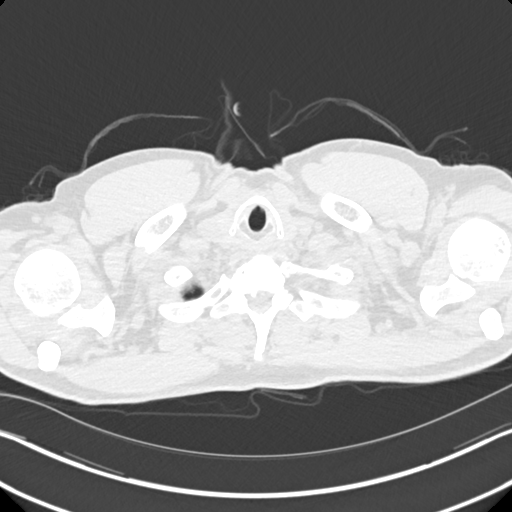

[Series 5: coronals chest 2.00 cor · coronal · 0.71mm/px · 3 of 130 slices shown]
[im 26/130  lung]
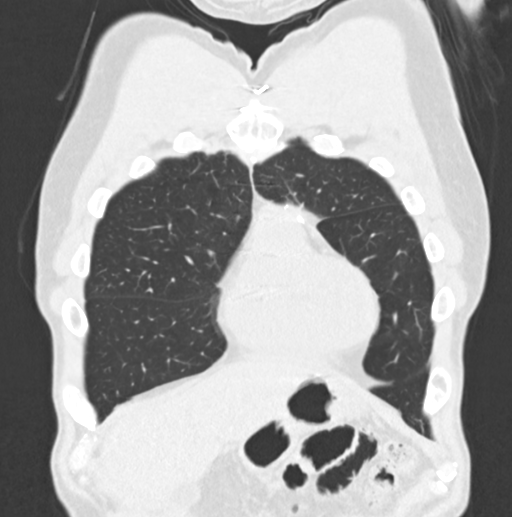
[im 52/130  lung]
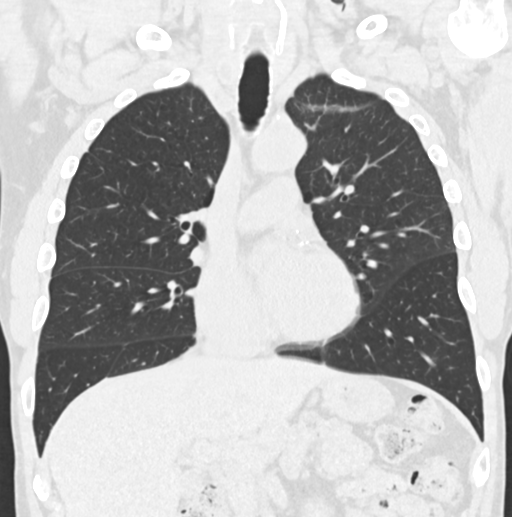
[im 78/130  lung]
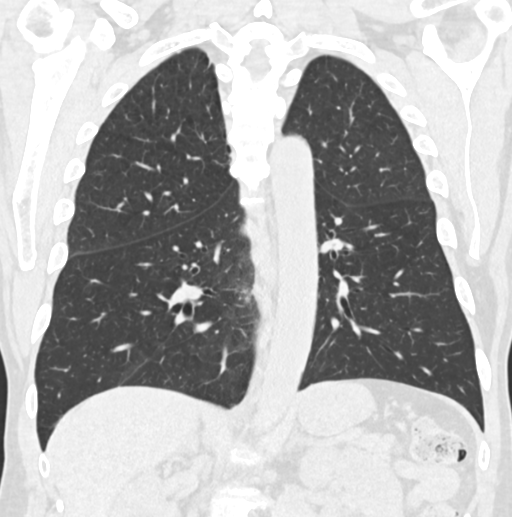

[15 of 36 positions shown; findings below may reference images not displayed]

FINDINGS: Cardiovascular: No evident thoracic aortic aneurysm. There are foci
calcification in the proximal left subclavian artery. Visualized
great vessels otherwise appear unremarkable on this noncontrast
enhanced study. There are foci of aortic atherosclerosis. There are
foci of native coronary artery calcification. Note that patient is
status post coronary bypass grafting. There is no pericardial
effusion or pericardial thickening.

Mediastinum/Nodes: There is an 8 mm nodular opacity in the right
lobe of the thyroid which per consensus guidelines does not warrant
additional imaging surveillance. Thyroid otherwise appears
unremarkable. There is no evident thoracic adenopathy. No esophageal
lesions are appreciable.

Lungs/Pleura: There is slight scarring in the extreme apices,
stable. Scarring in the anterior segment left upper lobe is stable.
Slight nodularity along this area of scarring measures 1.1 x 0.5 cm,
stable compared to prior studies. There is no change in this area
compared to prior CT examinations. There is no edema or airspace
opacity. No pleural effusions are evident. Slight lower lobe
bronchiectatic change bilaterally is stable.

Upper Abdomen: There are foci of abdominal aortic atherosclerosis.
Visualized upper abdominal structures otherwise appear unremarkable.

Musculoskeletal: Status post median sternotomy. There are foci of
degenerative change in the thoracic spine. No blastic or lytic bone
lesions. No evident chest wall lesions.
IMPRESSION: 1. Stable scarring anterior segment left upper lobe. Nodularity
along the medial aspect of this scarring is stable. No new
parenchymal lung opacity.

2.  Slight lower lobe bronchiectatic change bilaterally, stable.

3.  No edema or airspace opacity.  No pleural effusions.

4.  No evident adenopathy.

5. Aortic atherosclerosis as well as evidence of prior coronary
artery bypass grafting.

Aortic Atherosclerosis ([id number]-[id number].U).

## 2021-05-21 ENCOUNTER — Other Ambulatory Visit: Payer: Self-pay

## 2021-05-21 ENCOUNTER — Other Ambulatory Visit: Payer: Medicare Other

## 2021-05-21 DIAGNOSIS — E291 Testicular hypofunction: Secondary | ICD-10-CM

## 2021-05-21 DIAGNOSIS — N401 Enlarged prostate with lower urinary tract symptoms: Secondary | ICD-10-CM

## 2021-05-22 LAB — HEMATOCRIT: Hematocrit: 43.3 % (ref 37.5–51.0)

## 2021-05-22 LAB — PSA: Prostate Specific Ag, Serum: 0.9 ng/mL (ref 0.0–4.0)

## 2021-05-22 LAB — TESTOSTERONE: Testosterone: 505 ng/dL (ref 264–916)

## 2021-05-24 ENCOUNTER — Other Ambulatory Visit: Payer: Self-pay

## 2021-05-24 ENCOUNTER — Encounter: Payer: Self-pay | Admitting: Internal Medicine

## 2021-05-24 ENCOUNTER — Encounter: Payer: Self-pay | Admitting: *Deleted

## 2021-05-24 ENCOUNTER — Ambulatory Visit (INDEPENDENT_AMBULATORY_CARE_PROVIDER_SITE_OTHER): Payer: Medicare Other | Admitting: Internal Medicine

## 2021-05-24 VITALS — BP 126/70 | HR 75 | Temp 97.1°F | Ht 71.0 in | Wt 190.2 lb

## 2021-05-24 DIAGNOSIS — R9389 Abnormal findings on diagnostic imaging of other specified body structures: Secondary | ICD-10-CM

## 2021-05-24 NOTE — Patient Instructions (Signed)
Obtain CT chest

## 2021-05-24 NOTE — Progress Notes (Signed)
Name: ASHLEIGH ARYA MRN: [id number] DOB: 1950/08/17      CHIEF COMPLAINT:  Follow up Abnormal CT chest    SYNOPSIS/SCANS CT of the chest September 2021 No obvious interval changes that suggest malignancy Findings relayed to patient  PFTs also reviewed with patient again Spirometry is within normal limits No obvious obstructive or restrictive pattern  CT of the chest reviewed showed nodular densities since 2012 This is most likely benign finding However patient has extensive smoking history and I have explained to him that patient will probably need annual CT scans to assess for lung cancer    HPI No exacerbation at this time No evidence of heart failure at this time No evidence or signs of infection at this time No respiratory distress No fevers, chills, nausea, vomiting, diarrhea No evidence of lower extremity edema No evidence hemoptysis    PAST MEDICAL HISTORY :   has a past medical history of BPH (benign prostatic hyperplasia), Hyperlipidemia, and Hypertension.  has a past surgical history that includes Colonoscopy with propofol (N/A, 08/16/2018). Prior to Admission medications   Medication Sig Start Date End Date Taking? Authorizing Provider  aspirin EC 81 MG tablet Take by mouth.   Yes [provider]  simvastatin (ZOCOR) 20 MG tablet Take 20 mg by mouth daily.  10/15/13  Yes [provider]  testosterone cypionate (DEPOTESTOSTERONE CYPIONATE) 200 MG/ML injection Inject 1 mL (200 mg total) into the muscle every 14 (fourteen) days. 08/29/18  Yes Stoioff, Ronda Fairly, MD  omeprazole (PRILOSEC) 20 MG capsule TAKE ONE CAPSULE BY MOUTH EVERY MORNING 30 MINUTES BEFORE BREAKFAST 01/07/19   [provider]  sildenafil (VIAGRA) 50 MG tablet TAKE ONE TABLET BY MOUTH ONCE DAILY AS NEEDED FOR ERECTILE DYSFUNCTION 11/21/18   [provider]  simvastatin (ZOCOR) 10 MG tablet TAKE ONE TABLET BY MOUTH EVERY EVENING FOR CHOLESTEROL 01/14/19    [provider]   No Known Allergies  FAMILY HISTORY:  HTN  SOCIAL HISTORY:  reports that he quit smoking about 18 years ago. His smoking use included cigarettes. He has never used smokeless tobacco. He reports that he does not currently use alcohol. He reports that he does not use drugs.     Review of Systems:  Gen:  Denies  fever, sweats, chills weight loss  HEENT: Denies blurred vision, double vision, ear pain, eye pain, hearing loss, nose bleeds, sore throat Cardiac:  No dizziness, chest pain or heaviness, chest tightness,edema, No JVD Resp:   No cough, -sputum production, -shortness of breath,-wheezing, -hemoptysis,  Other:  All other systems negative      BP 126/70 (BP Location: Left Arm, Patient Position: Sitting, Cuff Size: Normal)   Pulse 75   Temp (!) 97.1 F (36.2 C) (Oral)   Ht 5\' 11"  (1.803 m)   Wt 190 lb 3.2 oz (86.3 kg)   SpO2 99%   BMI 26.53 kg/m    Physical Examination:   General Appearance: No distress  EYES PERRLA, EOM intact.   NECK Supple, No JVD Pulmonary: normal breath sounds, No wheezing.  CardiovascularNormal S1,S2.  No m/r/g.     ALL OTHER ROS ARE NEGATIVE   IMAGING   CT CHEST 04/2020   The CT chest  was Independently Reviewed By Me Today There is slight scarring in the extreme apices, stable. Scarring in the anterior segment left upper lobe is stable. Slight nodularity along this area of scarring measures 1.1 x 0.5 cm, stable compared to prior studies. There is no  change in this area compared to prior CT examinations. There is no edema or airspace opacity. No pleural effusions are evident. Slight lower lobe bronchiectatic change bilaterally is stable.    ASSESSMENT AND PLAN SYNOPSIS  Abnormal CT chest nodular opacity since 2012 but some have enlarged since October 2020 Most recent CT chest September 2021 does not reveal any significant changes in stability of scarring Recommend annual CT scans for lung cancer  screening  Patient does not have any respiratory symptoms at this time No indication for antibiotics or prednisone at this time      MEDICATION ADJUSTMENTS/LABS AND TESTS ORDERED: Follow up CT chest needed now  [place]   Patient satisfied with Plan of action and management. All questions answered Follow up 1 year   Corrin Parker, M.D.  [facility]  Medical Director [place] Director [facility] Cardio-Pulmonary Department

## 2021-06-08 ENCOUNTER — Other Ambulatory Visit: Payer: Self-pay | Admitting: Family Medicine

## 2021-06-08 DIAGNOSIS — N401 Enlarged prostate with lower urinary tract symptoms: Secondary | ICD-10-CM

## 2021-06-08 MED ORDER — ANASTROZOLE 1 MG PO TABS: 1.0000 mg | ORAL_TABLET | Freq: Every day | ORAL | 3 refills | Status: DC

## 2021-06-21 ENCOUNTER — Ambulatory Visit: Payer: Medicare Other

## 2021-06-21 ENCOUNTER — Ambulatory Visit
Admission: RE | Admit: 2021-06-21 | Discharge: 2021-06-21 | Disposition: A | Discharge status: Home / Self Care | Payer: Medicare Other | Source: Ambulatory Visit | Attending: Internal Medicine | Admitting: Internal Medicine

## 2021-06-21 ENCOUNTER — Other Ambulatory Visit: Payer: Self-pay

## 2021-06-21 DIAGNOSIS — R9389 Abnormal findings on diagnostic imaging of other specified body structures: Secondary | ICD-10-CM

## 2021-06-22 ENCOUNTER — Ambulatory Visit: Payer: Medicare Other | Attending: Internal Medicine

## 2021-08-17 ENCOUNTER — Other Ambulatory Visit: Payer: Self-pay | Admitting: Urology

## 2021-08-17 DIAGNOSIS — E291 Testicular hypofunction: Secondary | ICD-10-CM

## 2021-11-18 ENCOUNTER — Other Ambulatory Visit: Payer: Self-pay

## 2021-11-18 DIAGNOSIS — E291 Testicular hypofunction: Secondary | ICD-10-CM

## 2021-11-18 DIAGNOSIS — N401 Enlarged prostate with lower urinary tract symptoms: Secondary | ICD-10-CM

## 2021-11-19 ENCOUNTER — Other Ambulatory Visit: Payer: Medicare Other

## 2021-11-19 DIAGNOSIS — N401 Enlarged prostate with lower urinary tract symptoms: Secondary | ICD-10-CM

## 2021-11-19 DIAGNOSIS — E291 Testicular hypofunction: Secondary | ICD-10-CM

## 2021-11-20 LAB — HEMATOCRIT: Hematocrit: 42.4 % (ref 37.5–51.0)

## 2021-11-20 LAB — TESTOSTERONE: Testosterone: 575 ng/dL (ref 264–916)

## 2021-11-20 LAB — PSA: Prostate Specific Ag, Serum: 0.7 ng/mL (ref 0.0–4.0)

## 2021-11-25 ENCOUNTER — Ambulatory Visit: Payer: Self-pay | Admitting: Urology

## 2021-11-29 ENCOUNTER — Ambulatory Visit (INDEPENDENT_AMBULATORY_CARE_PROVIDER_SITE_OTHER): Payer: Medicare Other | Admitting: Urology

## 2021-11-29 ENCOUNTER — Encounter: Payer: Self-pay | Admitting: Urology

## 2021-11-29 VITALS — BP 153/94 | HR 70 | Ht 71.0 in | Wt 197.0 lb

## 2021-11-29 DIAGNOSIS — E291 Testicular hypofunction: Secondary | ICD-10-CM | POA: Diagnosis not present

## 2021-11-29 DIAGNOSIS — N5201 Erectile dysfunction due to arterial insufficiency: Secondary | ICD-10-CM

## 2021-11-29 DIAGNOSIS — N401 Enlarged prostate with lower urinary tract symptoms: Secondary | ICD-10-CM

## 2021-11-29 LAB — BLADDER SCAN AMB NON-IMAGING: Scan Result: 0

## 2021-11-29 MED ORDER — ANASTROZOLE 1 MG PO TABS: 1.0000 mg | ORAL_TABLET | Freq: Every day | ORAL | 11 refills | Status: DC

## 2021-11-29 MED ORDER — ALFUZOSIN HCL ER 10 MG PO TB24: 10.0000 mg | ORAL_TABLET | Freq: Every day | ORAL | 11 refills | Status: DC

## 2021-11-29 MED ORDER — TESTOSTERONE CYPIONATE 200 MG/ML IM SOLN: INTRAMUSCULAR | 3 refills | Status: DC

## 2021-11-29 MED ORDER — SILDENAFIL CITRATE 20 MG PO TABS: ORAL_TABLET | ORAL | 3 refills | Status: DC

## 2021-11-29 NOTE — Progress Notes (Signed)
? ?11/29/2021 ?8:28 AM  ? ?Jerome Nichols ?[birth year] ?[id number] ? ?Referring provider: Ranae Plumber, [place] ?[place] ?[place],  [place] [address] ? ?Chief Complaint  ?Patient presents with  ? Hypogonadism  ? ? ? ?Urologic history: ?1.  Hypogonadism ?            -Testosterone cypionate 140 mg every 2 weeks ? - Anastrozole 1 mg daily for elevated estradiol ?  ?2.  Erectile dysfunction            ?            -On sildenafil 50 mg ?  ?3.  BPH with lower urinary tract symptoms ? -On alfuzosin ? ?HPI: ?71 y.o. male presents for annual follow-up. ? ?Injecting 140 mg every 2 weeks ?Labs 11/19/2021: Testosterone 575, HCT 42.4 and PSA stable 0.7 ?Has noted decreased force and caliber of his urinary stream since last visit though states his PCP told him he should not take alfuzosin and sildenafil ?IPSS 13/35 ? ?PMH: ?Past Medical History:  ?Diagnosis Date  ? BPH (benign prostatic hyperplasia)   ? Hyperlipidemia   ? Hypertension   ? ? ?Surgical History: ?Past Surgical History:  ?Procedure Laterality Date  ? COLONOSCOPY WITH PROPOFOL N/A 08/16/2018  ? Procedure: COLONOSCOPY WITH PROPOFOL;  Surgeon: Jonathon Bellows, MD;  Location: [facility] ENDOSCOPY;  Service: Gastroenterology;  Laterality: N/A;  ? ? ?Home Medications:  ?Allergies as of 11/29/2021   ?No Known Allergies ?  ? ?  ?Medication List  ?  ? ?  ? Accurate as of Nov 29, 2021  8:28 AM. If you have any questions, ask your nurse or doctor.  ?  ?  ? ?  ? ?alfuzosin 10 MG 24 hr tablet ?Commonly known as: UROXATRAL ?Take 1 tablet (10 mg total) by mouth daily with breakfast. ?  ?anastrozole 1 MG tablet ?Commonly known as: ARIMIDEX ?Take 1 tablet (1 mg total) by mouth daily. ?  ?aspirin EC 81 MG tablet ?Take by mouth. ?  ?B-D 3CC LUER-LOK SYR 21GX1-1/2 21G X 1-1/2" 3 ML Misc ?Generic drug: SYRINGE-NEEDLE (DISP) 3 ML ?Use this needle to injection ?  ?BD SafetyGlide Needle 18G X 1-1/2" Misc ?Generic drug: NEEDLE (DISP) 18 G ?Draw up  Testosterone with this needle ?  ?lisinopril 10 MG  tablet ?Commonly known as: ZESTRIL ?Take 10 mg by mouth daily. ?  ?sildenafil 20 MG tablet ?Commonly known as: REVATIO ?2-5 tabs 1 hour prior to intercourse ?  ?simvastatin 20 MG tablet ?Commonly known as: ZOCOR ?Take 20 mg by mouth daily. ?  ?testosterone cypionate 200 MG/ML injection ?Commonly known as: DEPOTESTOSTERONE CYPIONATE ?INJECT 0.7ML ('140MG'$  TOTAL) INTO THE MUSCLE EVERY 14 DAYS ?  ? ?  ? ? ?Allergies: No Known Allergies ? ?Family History: ?History reviewed. No pertinent family history. ? ?Social History:  reports that he quit smoking about 19 years ago. His smoking use included cigarettes. He has never used smokeless tobacco. He reports that he does not currently use alcohol. He reports that he does not use drugs. ? ? ?Physical Exam: ?BP (!) 153/94   Pulse 70   Ht '5\' 11"'$  (1.803 m)   Wt 197 lb (89.4 kg)   BMI 27.48 kg/m?   ?Constitutional:  Alert and oriented, No acute distress. ?HEENT: [place] AT, moist mucus membranes.  Trachea midline, no masses. ?Cardiovascular: No clubbing, cyanosis, or edema. ?Respiratory: Normal respiratory effort, no increased work of breathing. ?GU: Prostate 50 g, smooth without nodules ?Lymph: No cervical or inguinal lymphadenopathy. ?  Skin: No rashes, bruises or suspicious lesions. ?Neurologic: Grossly intact, no focal deficits, moving all 4 extremities. ?Psychiatric: Normal mood and affect. ? ? ?Assessment & Plan:   ? ?1.  Hypogonadism ?Good energy level and symptomatic improvement on TRT ?35-monthlab visit for testosterone/hematocrit ?1 year follow-up office visit with testosterone, estradiol, PSA, hematocrit ?Testosterone and anastrozole were refilled ?He is considering stopping TRT in the near future and if he elects to discontinue he will call back for a testosterone level if symptoms recur ? ?2.  BPH with LUTS ?Recommend he restart alfuzosin based on his voiding symptoms.  He was informed there is no contraindication to take an alpha-blocker and a PDE 5 inhibitor ?Bladder  scan PVR 0 mL ? ?3.  Erectile dysfunction ?Stable on sildenafil; refilled ? ? ? ?SAbbie Sons MD ? ?[place]?[address] ?[place] [address]?[phone]? ?

## 2022-03-10 ENCOUNTER — Other Ambulatory Visit: Payer: Self-pay

## 2022-03-10 DIAGNOSIS — R9389 Abnormal findings on diagnostic imaging of other specified body structures: Secondary | ICD-10-CM

## 2022-04-29 ENCOUNTER — Encounter: Payer: Self-pay | Admitting: Urology

## 2022-05-03 ENCOUNTER — Telehealth: Payer: Self-pay | Admitting: Internal Medicine

## 2022-05-06 NOTE — Telephone Encounter (Signed)
I have left a message asking Mr. Jerome Nichols to call me back to schedule this CT

## 2022-05-10 NOTE — Telephone Encounter (Signed)
I have spoke with Mr. Fauth and his CT has been scheduled on 06/28/2022 @ 8:30am at [facility] and he is aware

## 2022-05-10 NOTE — Telephone Encounter (Signed)
I have left another message asking Jerome Nichols to call and schedule his CT

## 2022-06-06 ENCOUNTER — Other Ambulatory Visit: Payer: Medicare Other

## 2022-06-06 DIAGNOSIS — N401 Enlarged prostate with lower urinary tract symptoms: Secondary | ICD-10-CM

## 2022-06-06 DIAGNOSIS — E291 Testicular hypofunction: Secondary | ICD-10-CM

## 2022-06-07 LAB — HEMATOCRIT: Hematocrit: 42.3 % (ref 37.5–51.0)

## 2022-06-07 LAB — TESTOSTERONE: Testosterone: 505 ng/dL (ref 264–916)

## 2022-06-20 ENCOUNTER — Ambulatory Visit: Payer: Medicare Other | Admitting: Internal Medicine

## 2022-06-28 ENCOUNTER — Ambulatory Visit
Admission: RE | Admit: 2022-06-28 | Discharge: 2022-06-28 | Disposition: A | Discharge status: Home / Self Care | Payer: Medicare Other | Source: Ambulatory Visit | Attending: Family Medicine | Admitting: Family Medicine

## 2022-06-28 DIAGNOSIS — R9389 Abnormal findings on diagnostic imaging of other specified body structures: Secondary | ICD-10-CM | POA: Insufficient documentation

## 2022-07-01 ENCOUNTER — Encounter: Payer: Self-pay | Admitting: Internal Medicine

## 2022-07-01 ENCOUNTER — Ambulatory Visit (INDEPENDENT_AMBULATORY_CARE_PROVIDER_SITE_OTHER): Payer: Medicare Other | Admitting: Internal Medicine

## 2022-07-01 VITALS — BP 124/70 | HR 70 | Temp 97.7°F | Ht 71.0 in | Wt 185.0 lb

## 2022-07-01 DIAGNOSIS — R9389 Abnormal findings on diagnostic imaging of other specified body structures: Secondary | ICD-10-CM | POA: Diagnosis not present

## 2022-07-01 NOTE — Progress Notes (Signed)
Name: Jerome Nichols MRN: [id number] DOB: March 20, 1951       SYNOPSIS/SCANS CT of the chest September 2021 No obvious interval changes that suggest malignancy Findings relayed to patient  PFTs also reviewed with patient again Spirometry is within normal limits No obvious obstructive or restrictive pattern  CT of the chest reviewed showed nodular densities since 2012 This is most likely benign finding However patient has extensive smoking history and I have explained to him that patient will probably need annual CT scans to assess for lung cancer   QUIT SMOKING 2004 1.5 PPD for 15 years   CHIEF COMPLAINT:  Follow up Abnormal CT chest    HPI  No exacerbation at this time No evidence of heart failure at this time No evidence or signs of infection at this time No respiratory distress No fevers, chills, nausea, vomiting, diarrhea No evidence of lower extremity edema No evidence hemoptysis  Follow up LUL nodule Linear scarring stable for last 2 years   PAST MEDICAL HISTORY :   has a past medical history of BPH (benign prostatic hyperplasia), Hyperlipidemia, and Hypertension.  has a past surgical history that includes Colonoscopy with propofol (N/A, 08/16/2018). Prior to Admission medications   Medication Sig Start Date End Date Taking? Authorizing Provider  aspirin EC 81 MG tablet Take by mouth.   Yes [provider]  simvastatin (ZOCOR) 20 MG tablet Take 20 mg by mouth daily.  10/15/13  Yes [provider]  testosterone cypionate (DEPOTESTOSTERONE CYPIONATE) 200 MG/ML injection Inject 1 mL (200 mg total) into the muscle every 14 (fourteen) days. 08/29/18  Yes Stoioff, Ronda Fairly, MD  omeprazole (PRILOSEC) 20 MG capsule TAKE ONE CAPSULE BY MOUTH EVERY MORNING 30 MINUTES BEFORE BREAKFAST 01/07/19   [provider]  sildenafil (VIAGRA) 50 MG tablet TAKE ONE TABLET BY MOUTH ONCE DAILY AS NEEDED FOR ERECTILE DYSFUNCTION 11/21/18   [provider]  simvastatin (ZOCOR) 10 MG tablet TAKE ONE TABLET BY MOUTH EVERY EVENING FOR CHOLESTEROL 01/14/19   [provider]   No Known Allergies  FAMILY HISTORY:  HTN  SOCIAL HISTORY:  reports that he quit smoking about 19 years ago. His smoking use included cigarettes. He has never used smokeless tobacco. He reports that he does not currently use alcohol. He reports that he does not use drugs.      BP 124/70 (BP Location: Left Arm, Cuff Size: Normal)   Pulse 70   Temp 97.7 F (36.5 C) (Temporal)   Ht '5\' 11"'$  (1.803 m)   Wt 185 lb (83.9 kg)   SpO2 97%   BMI 25.80 kg/m      Review of Systems: Gen:  Denies  fever, sweats, chills weight loss  HEENT: Denies blurred vision, double vision, ear pain, eye pain, hearing loss, nose bleeds, sore throat Cardiac:  No dizziness, chest pain or heaviness, chest tightness,edema, No JVD Resp:   No cough, -sputum production, -shortness of breath,-wheezing, -hemoptysis,  Other:  All other systems negative    Physical Examination:   General Appearance: No distress  EYES PERRLA, EOM intact.   NECK Supple, No JVD Pulmonary: normal breath sounds, No wheezing.  CardiovascularNormal S1,S2.  No m/r/g.   Abdomen: Benign, Soft, non-tender. ALL OTHER ROS ARE NEGATIVE    IMAGING   CT CHEST 04/2020   The CT chest  was Independently Reviewed By Me Today There is slight scarring in the extreme apices, stable. Scarring in the anterior segment left upper lobe is stable. Slight  nodularity along this area of scarring measures 1.1 x 0.5 cm, stable compared to prior studies. There is no change in this area compared to prior CT examinations. There is no edema or airspace opacity. No pleural effusions are evident. Slight lower lobe bronchiectatic change bilaterally is stable.  CT CHEST 06/2022 Stable 1.1 x 0.5 cm nodular density associated with scarring in the left upper lobe apex stable since 2021  Emphysema and additional scarring  change.      ASSESSMENT AND PLAN SYNOPSIS  Abnormal CT chest nodular opacity since 2012 but some have enlarged since October 2020 Most recent CT chest OCT 2023 does not reveal any significant changes in stability of scarring Recommend annual CT scans for lung cancer screening  Patient does not have any respiratory symptoms at this time No indication for antibiotics or prednisone at this time   +ABNORMAL PLAQUES OF CORONARY ARTERIES RECOMMEND CARDIOLOGY FOLLOW UP   MEDICATION ADJUSTMENTS/LABS AND TESTS ORDERED: Follow up CT chest in one year Follow up Cardiology as needed  CURRENT MEDICATIONS REVIEWED AT LENGTH WITH PATIENT TODAY  TOTAL TIME SPENT 23 mins  Follow up in 1 year   Beatryce Colombo Patricia Pesa, M.D.  [facility]  Medical Director [place] Director [facility] Cardio-Pulmonary Department

## 2022-07-01 NOTE — Patient Instructions (Signed)
Recommend Follow up Cardiology  Follow up CT chest

## 2022-11-30 ENCOUNTER — Other Ambulatory Visit: Payer: Medicare HMO

## 2022-11-30 DIAGNOSIS — E291 Testicular hypofunction: Secondary | ICD-10-CM

## 2022-11-30 DIAGNOSIS — N401 Enlarged prostate with lower urinary tract symptoms: Secondary | ICD-10-CM

## 2022-12-01 ENCOUNTER — Other Ambulatory Visit: Payer: Medicare Other

## 2022-12-01 LAB — HEMATOCRIT: Hematocrit: 39.6 % (ref 37.5–51.0)

## 2022-12-01 LAB — PSA: Prostate Specific Ag, Serum: 0.6 ng/mL (ref 0.0–4.0)

## 2022-12-01 LAB — TESTOSTERONE: Testosterone: 416 ng/dL (ref 264–916)

## 2022-12-07 ENCOUNTER — Ambulatory Visit: Payer: Medicare Other | Admitting: Urology

## 2022-12-12 ENCOUNTER — Ambulatory Visit: Payer: Medicare HMO | Admitting: Urology

## 2022-12-12 ENCOUNTER — Ambulatory Visit (INDEPENDENT_AMBULATORY_CARE_PROVIDER_SITE_OTHER): Payer: Medicare HMO | Admitting: Urology

## 2022-12-12 ENCOUNTER — Encounter: Payer: Self-pay | Admitting: Urology

## 2022-12-12 VITALS — BP 140/86 | HR 61 | Ht 71.0 in | Wt 183.0 lb

## 2022-12-12 DIAGNOSIS — E291 Testicular hypofunction: Secondary | ICD-10-CM

## 2022-12-12 DIAGNOSIS — N5201 Erectile dysfunction due to arterial insufficiency: Secondary | ICD-10-CM

## 2022-12-12 DIAGNOSIS — N401 Enlarged prostate with lower urinary tract symptoms: Secondary | ICD-10-CM | POA: Diagnosis not present

## 2022-12-12 LAB — BLADDER SCAN AMB NON-IMAGING: Scan Result: 15

## 2022-12-12 MED ORDER — TAMSULOSIN HCL 0.4 MG PO CAPS: 0.4000 mg | ORAL_CAPSULE | Freq: Every day | ORAL | 0 refills | Status: AC

## 2022-12-12 NOTE — Progress Notes (Signed)
I, Maysun L Gibbs,acting as a scribe for Jerome Altes, MD.,have documented all relevant documentation on the behalf of Jerome Altes, MD,as directed by  Jerome Altes, MD while in the presence of Jerome Altes, MD.  12/12/2022 2:01 PM   Jerome Nichols Aug 16, 1950 [id number]  Referring provider: Shane Crutch, PA [address] [place],  [place] [address]  Chief Complaint  Patient presents with   Benign Prostatic Hypertrophy   Urologic history: 1.  Hypogonadism Was taking Testosterone cypionate 140 mg every 2 weeks Tried Anastrozole 1 mg daily for elevated estradiol   2.  Erectile dysfunction            On Sildenafil 50 mg   3.  BPH with lower urinary tract symptoms History of Alfuzosin  HPI: Jerome Nichols is a 72 y.o. male here for annual follow-up.   Labs 11/30/22 testosterone 416, PSA stable 0.6, and hematocrit stable 39.6. He is no longer taking Alfuzosin, Anastrozole or Testosterone. Stopped about 2 months ago because he did not feel they were providing any benefit. He feels he did best on AndroGel but could not afford. He felt better when his testosterone was in the upper limit of normal at around 700. Does have bothersome frequency and urgency. IPSS today 15/35. Also has nocturia x3.   PMH: Past Medical History:  Diagnosis Date   BPH (benign prostatic hyperplasia)    Hyperlipidemia    Hypertension     Surgical History: Past Surgical History:  Procedure Laterality Date   COLONOSCOPY WITH PROPOFOL N/A 08/16/2018   Procedure: COLONOSCOPY WITH PROPOFOL;  Surgeon: Wyline Mood, MD;  Location: [facility] ENDOSCOPY;  Service: Gastroenterology;  Laterality: N/A;    Home Medications:  Allergies as of 12/12/2022   No Known Allergies      Medication List        Accurate as of Dec 12, 2022  2:01 PM. If you have any questions, ask your nurse or doctor.          STOP taking these medications    alfuzosin 10 MG 24 hr tablet Commonly known as:  UROXATRAL Stopped by: Jerome Altes, MD   anastrozole 1 MG tablet Commonly known as: ARIMIDEX Stopped by: Jerome Altes, MD   sildenafil 20 MG tablet Commonly known as: REVATIO Stopped by: Jerome Altes, MD   testosterone cypionate 200 MG/ML injection Commonly known as: DEPOTESTOSTERONE CYPIONATE Stopped by: Jerome Altes, MD       TAKE these medications    aspirin EC 81 MG tablet Take by mouth.   B-D 3CC LUER-LOK SYR 21GX1-1/2 21G X 1-1/2" 3 ML Misc Generic drug: SYRINGE-NEEDLE (DISP) 3 ML Use this needle to injection   BD SafetyGlide Needle 18G X 1-1/2" Misc Generic drug: NEEDLE (DISP) 18 G Draw up  Testosterone with this needle   lisinopril 10 MG tablet Commonly known as: ZESTRIL Take 10 mg by mouth daily.   simvastatin 20 MG tablet Commonly known as: ZOCOR Take 20 mg by mouth daily.   tamsulosin 0.4 MG Caps capsule Commonly known as: FLOMAX Take 1 capsule (0.4 mg total) by mouth daily. Started by: Jerome Altes, MD        Allergies: No Known Allergies  Social History:  reports that he quit smoking about 20 years ago. His smoking use included cigarettes. He has a 30.00 pack-year smoking history. He has never used smokeless tobacco. He reports that he does not currently use alcohol. He reports  that he does not use drugs.   Physical Exam: BP (!) 140/86   Pulse 61   Ht 5\' 11"  (1.803 m)   Wt 183 lb (83 kg)   BMI 25.52 kg/m   Constitutional:  Alert and oriented, No acute distress. HEENT: [place] AT, moist mucus membranes.  Trachea midline, no masses. Cardiovascular: No clubbing, cyanosis, or edema. Respiratory: Normal respiratory effort, no increased work of breathing. GI: Abdomen is soft, nontender, nondistended, no abdominal masses Skin: No rashes, bruises or suspicious lesions. Neurologic: Grossly intact, no focal deficits, moving all 4 extremities. Psychiatric: Normal mood and affect.   Assessment & Plan:    1. BPH with LUTS. He was  interested in a trial of Tamsulosin 0.4 mg daily, Rx sent to pharmacy for a 30 day trial. PVR today 15 mL  2. Hypogonadism We did discuss the availability of generic gel and he requested an Rx. His current level was normal at 416 and would only recommend one pump daily with a follow up testosterone level in 6 weeks. He will not need to keep the lab visit for testosterone if he does not start the AndroGel.  [facility] [address] [place], [place] [address] [phone]

## 2022-12-12 NOTE — Progress Notes (Signed)
Jerome Nichols presents for an office/procedure visit. BP today is high He is complaint/noncompliant with BP medication. Greater than 140/90. Provider  notified. Pt advised to see his PCP for Blood pressure . Pt voiced understanding.

## 2022-12-13 ENCOUNTER — Telehealth: Payer: Self-pay

## 2022-12-13 MED ORDER — TESTOSTERONE 20.25 MG/ACT (1.62%) TD GEL: TRANSDERMAL | 0 refills | Status: DC

## 2022-12-13 NOTE — Telephone Encounter (Signed)
Patient states that he was seen yesterday and was told that a topical Testerone would be sent in to Pharmacy, but nothing is listed in his chart. He states that he does have a lab appt. If you can send this in to the [facility]

## 2023-01-23 ENCOUNTER — Other Ambulatory Visit: Payer: Medicare HMO

## 2023-01-25 ENCOUNTER — Other Ambulatory Visit: Payer: Self-pay | Admitting: Urology

## 2023-01-25 DIAGNOSIS — N401 Enlarged prostate with lower urinary tract symptoms: Secondary | ICD-10-CM

## 2023-07-11 ENCOUNTER — Other Ambulatory Visit: Payer: Self-pay | Admitting: Urology

## 2023-08-29 ENCOUNTER — Other Ambulatory Visit: Payer: Self-pay | Admitting: Internal Medicine

## 2023-08-29 DIAGNOSIS — F17210 Nicotine dependence, cigarettes, uncomplicated: Secondary | ICD-10-CM

## 2023-08-29 DIAGNOSIS — Z136 Encounter for screening for cardiovascular disorders: Secondary | ICD-10-CM

## 2023-08-29 DIAGNOSIS — E782 Mixed hyperlipidemia: Secondary | ICD-10-CM

## 2023-08-29 DIAGNOSIS — Z87891 Personal history of nicotine dependence: Secondary | ICD-10-CM

## 2023-09-06 ENCOUNTER — Encounter (HOSPITAL_COMMUNITY): Payer: Self-pay

## 2023-09-11 ENCOUNTER — Ambulatory Visit
Admission: RE | Admit: 2023-09-11 | Discharge: 2023-09-11 | Disposition: A | Discharge status: Home / Self Care | Payer: Medicare HMO | Source: Ambulatory Visit | Attending: Internal Medicine | Admitting: Internal Medicine

## 2023-09-11 DIAGNOSIS — F17210 Nicotine dependence, cigarettes, uncomplicated: Secondary | ICD-10-CM | POA: Diagnosis not present

## 2023-09-11 DIAGNOSIS — I10 Essential (primary) hypertension: Secondary | ICD-10-CM | POA: Insufficient documentation

## 2023-09-11 DIAGNOSIS — E782 Mixed hyperlipidemia: Secondary | ICD-10-CM | POA: Insufficient documentation

## 2023-09-11 DIAGNOSIS — Z136 Encounter for screening for cardiovascular disorders: Secondary | ICD-10-CM | POA: Insufficient documentation

## 2023-09-11 DIAGNOSIS — Z87891 Personal history of nicotine dependence: Secondary | ICD-10-CM | POA: Insufficient documentation

## 2023-09-11 DIAGNOSIS — I25810 Atherosclerosis of coronary artery bypass graft(s) without angina pectoris: Secondary | ICD-10-CM | POA: Diagnosis not present

## 2023-09-11 MED ORDER — METOPROLOL TARTRATE 5 MG/5ML IV SOLN
INTRAVENOUS | Status: AC
  Filled 2023-09-11: qty 10

## 2023-09-11 MED ORDER — NITROGLYCERIN 0.4 MG SL SUBL
0.8000 mg | SUBLINGUAL_TABLET | Freq: Once | SUBLINGUAL | Status: AC
  Administered 2023-09-11: 0.8 mg via SUBLINGUAL
  Filled 2023-09-11: qty 25

## 2023-09-11 MED ORDER — METOPROLOL TARTRATE 5 MG/5ML IV SOLN
10.0000 mg | INTRAVENOUS | Status: DC | PRN
  Administered 2023-09-11: 2.5 mg via INTRAVENOUS
  Filled 2023-09-11 (×2): qty 10

## 2023-09-11 MED ORDER — DILTIAZEM HCL 25 MG/5ML IV SOLN
10.0000 mg | INTRAVENOUS | Status: DC | PRN
  Filled 2023-09-11: qty 5

## 2023-09-11 MED ORDER — IOHEXOL 350 MG/ML SOLN
80.0000 mL | Freq: Once | INTRAVENOUS | Status: AC | PRN
  Administered 2023-09-11: 75 mL via INTRAVENOUS

## 2023-09-11 NOTE — Progress Notes (Signed)
 Patient tolerated procedure well. Ambulate w/o difficulty. Denies any lightheadedness or being dizzy. Pt denies any pain at this time. Sitting in chair. Pt is encouraged to drink additional water throughout the day and reason explained to patient. Patient verbalized understanding and all questions answered. ABC intact. No further needs at this time. Discharge from procedure area w/o issues.

## 2023-09-25 ENCOUNTER — Other Ambulatory Visit: Payer: Self-pay | Admitting: Urology

## 2023-09-26 ENCOUNTER — Other Ambulatory Visit: Payer: Self-pay | Admitting: *Deleted

## 2023-09-26 DIAGNOSIS — E291 Testicular hypofunction: Secondary | ICD-10-CM

## 2023-09-27 ENCOUNTER — Other Ambulatory Visit: Payer: Medicare HMO

## 2023-09-27 DIAGNOSIS — E291 Testicular hypofunction: Secondary | ICD-10-CM

## 2023-09-28 LAB — TESTOSTERONE: Testosterone: 399 ng/dL (ref 264–916)

## 2023-10-17 ENCOUNTER — Other Ambulatory Visit: Payer: Self-pay | Admitting: Urology

## 2023-10-30 ENCOUNTER — Ambulatory Visit (INDEPENDENT_AMBULATORY_CARE_PROVIDER_SITE_OTHER): Payer: Medicare HMO | Admitting: Urology

## 2023-10-30 VITALS — BP 153/87 | HR 75 | Ht 71.0 in | Wt 195.0 lb

## 2023-10-30 DIAGNOSIS — N401 Enlarged prostate with lower urinary tract symptoms: Secondary | ICD-10-CM

## 2023-10-30 MED ORDER — TAMSULOSIN HCL 0.4 MG PO CAPS: 0.4000 mg | ORAL_CAPSULE | Freq: Every day | ORAL | 1 refills | Status: AC

## 2023-10-30 NOTE — Progress Notes (Signed)
 I, Jerome Nichols, acting as a scribe for Jerome Altes, MD., have documented all relevant documentation on the behalf of Jerome Altes, MD, as directed by Jerome Altes, MD while in the presence of Jerome Altes, MD.  10/30/2023 6:42 PM   Jerome Nichols 01-19-1951 [id number]  Referring provider: Shane Crutch, PA [address] [place],  [place] [address]  Chief Complaint  Patient presents with   Urinary Frequency   Urologic history: 1.  Hypogonadism Was taking Testosterone cypionate 140 mg every 2 weeks Tried Anastrozole 1 mg daily for elevated estradiol   2.  Erectile dysfunction            On Sildenafil 50 mg   3.  BPH with lower urinary tract symptoms History of Alfuzosin  HPI: Jerome Nichols is a 73 y.o. male presents for follow-up.   Patient called for an appointment to discuss urinary symptoms.  For the past several months, he has also complained of pelvic pain with ejaculation.  Denies dysuria, gross hematuria.  Denies flank, abdominal, or pelvic pain. His most bothersome lower urinary tract symptom is a weak urinary stream. He also has urinary frequency, urgency, and nocturia x3.  IPSS today 18/35 with QLL rated 4 on a 0-6 scale.   PMH: Past Medical History:  Diagnosis Date   BPH (benign prostatic hyperplasia)    Hyperlipidemia    Hypertension     Surgical History: Past Surgical History:  Procedure Laterality Date   COLONOSCOPY WITH PROPOFOL N/A 08/16/2018   Procedure: COLONOSCOPY WITH PROPOFOL;  Surgeon: Wyline Mood, MD;  Location[facility] ENDOSCOPY;  Service: Gastroenterology;  Laterality: N/A;    Home Medications:  Allergies as of 10/30/2023   No Known Allergies      Medication List        Accurate as of October 30, 2023  6:42 PM. If you have any questions, ask your nurse or doctor.          STOP taking these medications    B-D 3CC LUER-LOK SYR 21GX1-1/2 21G X 1-1/2" 3 ML Misc Generic drug: SYRINGE-NEEDLE (DISP) 3 ML    BD SafetyGlide Needle 18G X 1-1/2" Misc Generic drug: NEEDLE (DISP) 18 G   lisinopril 10 MG tablet Commonly known as: ZESTRIL       TAKE these medications    aspirin EC 81 MG tablet Take by mouth.   simvastatin 20 MG tablet Commonly known as: ZOCOR Take 20 mg by mouth daily.   tamsulosin 0.4 MG Caps capsule Commonly known as: FLOMAX Take 1 capsule (0.4 mg total) by mouth daily. What changed: Another medication with the same name was added. Make sure you understand how and when to take each.   tamsulosin 0.4 MG Caps capsule Commonly known as: FLOMAX Take 1 capsule (0.4 mg total) by mouth daily. What changed: You were already taking a medication with the same name, and this prescription was added. Make sure you understand how and when to take each.   Testosterone 20.25 MG/ACT (1.62%) Gel APPLY 1 PUMP TO UPPER ARM DAILY.        Allergies: No Known Allergies  Social History:  reports that he quit smoking about 21 years ago. His smoking use included cigarettes. He started smoking about 41 years ago. He has a 30 pack-year smoking history. He has never used smokeless tobacco. He reports that he does not currently use alcohol. He reports that he does not use drugs.   Physical Exam:  BP (!) 153/87   Pulse 75   Ht 5\' 11"  (1.803 m)   Wt 195 lb (88.5 kg)   BMI 27.20 kg/m   Constitutional:  Alert and oriented, No acute distress. HEENT: [place] AT, moist mucus membranes.  Trachea midline, no masses. Cardiovascular: No clubbing, cyanosis, or edema. Respiratory: Normal respiratory effort, no increased work of breathing. GI: Abdomen is soft, nontender, nondistended, no abdominal masses GU: Prostate 50 grams, smooth without nodules.  Skin: No rashes, bruises or suspicious lesions. Neurologic: Grossly intact, no focal deficits, moving all 4 extremities. Psychiatric: Normal mood and affect.   Assessment & Plan:    1. BPH with LUTS Previously on alpha blocker, and he does not  recall the efficacy of these medications.  Trial tamsulosin 0.4 mg daily, and will call back in 1 month regarding efficacy.  We discussed pain with ejaculation is most likely secondary to prostate inflammation, and tamsulosin may help this discomfort.  [facility] [address], [address] [place], [place] [address] [phone]

## 2023-10-31 ENCOUNTER — Encounter: Payer: Self-pay | Admitting: Urology

## 2023-12-13 ENCOUNTER — Ambulatory Visit: Payer: Medicare HMO | Admitting: Urology

## 2023-12-14 ENCOUNTER — Ambulatory Visit: Payer: Self-pay | Admitting: Urology

## 2024-01-10 ENCOUNTER — Ambulatory Visit: Admitting: Urology

## 2024-02-13 ENCOUNTER — Other Ambulatory Visit: Payer: Self-pay | Admitting: Urology

## 2024-03-25 ENCOUNTER — Other Ambulatory Visit

## 2025-01-09 ENCOUNTER — Ambulatory Visit: Admitting: Urology
